# Patient Record
Sex: Female | Born: 1950 | Race: White | Hispanic: No | Marital: Married | Smoking: Never smoker
Health system: Southern US, Community
[De-identification: ages and names within clinical notes are randomized; demographics above are authoritative.]

## PROBLEM LIST (undated history)

## (undated) DIAGNOSIS — K219 Gastro-esophageal reflux disease without esophagitis: Secondary | ICD-10-CM

## (undated) DIAGNOSIS — Z87442 Personal history of urinary calculi: Secondary | ICD-10-CM

## (undated) DIAGNOSIS — D126 Benign neoplasm of colon, unspecified: Secondary | ICD-10-CM

## (undated) DIAGNOSIS — M8580 Other specified disorders of bone density and structure, unspecified site: Secondary | ICD-10-CM

## (undated) DIAGNOSIS — T7840XA Allergy, unspecified, initial encounter: Secondary | ICD-10-CM

## (undated) DIAGNOSIS — F32A Depression, unspecified: Secondary | ICD-10-CM

## (undated) DIAGNOSIS — G4730 Sleep apnea, unspecified: Secondary | ICD-10-CM

## (undated) DIAGNOSIS — Q819 Epidermolysis bullosa, unspecified: Secondary | ICD-10-CM

## (undated) DIAGNOSIS — E785 Hyperlipidemia, unspecified: Secondary | ICD-10-CM

## (undated) DIAGNOSIS — Z8489 Family history of other specified conditions: Secondary | ICD-10-CM

## (undated) DIAGNOSIS — F329 Major depressive disorder, single episode, unspecified: Secondary | ICD-10-CM

## (undated) DIAGNOSIS — G4733 Obstructive sleep apnea (adult) (pediatric): Secondary | ICD-10-CM

## (undated) DIAGNOSIS — M1990 Unspecified osteoarthritis, unspecified site: Secondary | ICD-10-CM

## (undated) DIAGNOSIS — G709 Myoneural disorder, unspecified: Secondary | ICD-10-CM

## (undated) DIAGNOSIS — E119 Type 2 diabetes mellitus without complications: Secondary | ICD-10-CM

## (undated) HISTORY — PX: KNEE ARTHROSCOPY: SHX127

## (undated) HISTORY — PX: APPENDECTOMY: SHX54

## (undated) HISTORY — PX: VAGINAL HYSTERECTOMY: SHX2639

## (undated) HISTORY — DX: Obstructive sleep apnea (adult) (pediatric): G47.33

## (undated) HISTORY — PX: SHOULDER SURGERY: SHX246

## (undated) HISTORY — PX: COLONOSCOPY: SHX174

## (undated) HISTORY — DX: Epidermolysis bullosa, unspecified: Q81.9

## (undated) HISTORY — DX: Depression, unspecified: F32.A

## (undated) HISTORY — DX: Other specified disorders of bone density and structure, unspecified site: M85.80

## (undated) HISTORY — DX: Benign neoplasm of colon, unspecified: D12.6

## (undated) HISTORY — DX: Gastro-esophageal reflux disease without esophagitis: K21.9

## (undated) HISTORY — PX: ABDOMINAL HYSTERECTOMY: SHX81

## (undated) HISTORY — DX: Unspecified osteoarthritis, unspecified site: M19.90

## (undated) HISTORY — DX: Myoneural disorder, unspecified: G70.9

## (undated) HISTORY — PX: POLYPECTOMY: SHX149

## (undated) HISTORY — DX: Sleep apnea, unspecified: G47.30

## (undated) HISTORY — DX: Allergy, unspecified, initial encounter: T78.40XA

## (undated) HISTORY — DX: Type 2 diabetes mellitus without complications: E11.9

## (undated) HISTORY — DX: Hyperlipidemia, unspecified: E78.5

## (undated) HISTORY — DX: Major depressive disorder, single episode, unspecified: F32.9

## (undated) HISTORY — DX: Personal history of urinary calculi: Z87.442

---

## 1991-07-28 DIAGNOSIS — D126 Benign neoplasm of colon, unspecified: Secondary | ICD-10-CM

## 1991-07-28 HISTORY — DX: Benign neoplasm of colon, unspecified: D12.6

## 2000-10-14 ENCOUNTER — Other Ambulatory Visit: Admission: RE | Admit: 2000-10-14 | Discharge: 2000-10-14 | Payer: Self-pay | Admitting: Obstetrics and Gynecology

## 2001-03-16 ENCOUNTER — Ambulatory Visit (HOSPITAL_COMMUNITY): Admission: RE | Admit: 2001-03-16 | Discharge: 2001-03-16 | Payer: Self-pay | Admitting: Obstetrics and Gynecology

## 2001-03-16 ENCOUNTER — Encounter: Payer: Self-pay | Admitting: Obstetrics and Gynecology

## 2001-10-18 ENCOUNTER — Other Ambulatory Visit: Admission: RE | Admit: 2001-10-18 | Discharge: 2001-10-18 | Payer: Self-pay | Admitting: Obstetrics and Gynecology

## 2002-03-21 ENCOUNTER — Encounter: Payer: Self-pay | Admitting: Obstetrics and Gynecology

## 2002-03-21 ENCOUNTER — Ambulatory Visit (HOSPITAL_COMMUNITY): Admission: RE | Admit: 2002-03-21 | Discharge: 2002-03-21 | Payer: Self-pay | Admitting: Obstetrics and Gynecology

## 2003-04-10 ENCOUNTER — Ambulatory Visit (HOSPITAL_COMMUNITY): Admission: RE | Admit: 2003-04-10 | Discharge: 2003-04-10 | Payer: Self-pay | Admitting: Obstetrics and Gynecology

## 2003-04-10 ENCOUNTER — Encounter: Payer: Self-pay | Admitting: Obstetrics and Gynecology

## 2004-04-15 ENCOUNTER — Ambulatory Visit (HOSPITAL_COMMUNITY): Admission: RE | Admit: 2004-04-15 | Discharge: 2004-04-15 | Payer: Self-pay | Admitting: Obstetrics and Gynecology

## 2005-05-01 ENCOUNTER — Ambulatory Visit (HOSPITAL_COMMUNITY): Admission: RE | Admit: 2005-05-01 | Discharge: 2005-05-01 | Payer: Self-pay | Admitting: Obstetrics and Gynecology

## 2006-05-04 ENCOUNTER — Ambulatory Visit (HOSPITAL_COMMUNITY): Admission: RE | Admit: 2006-05-04 | Discharge: 2006-05-04 | Payer: Self-pay | Admitting: Obstetrics and Gynecology

## 2006-05-17 ENCOUNTER — Encounter: Admission: RE | Admit: 2006-05-17 | Discharge: 2006-05-17 | Payer: Self-pay | Admitting: Obstetrics and Gynecology

## 2006-10-29 ENCOUNTER — Encounter: Admission: RE | Admit: 2006-10-29 | Discharge: 2006-10-29 | Payer: Self-pay | Admitting: Obstetrics and Gynecology

## 2007-05-09 ENCOUNTER — Encounter: Admission: RE | Admit: 2007-05-09 | Discharge: 2007-05-09 | Payer: Self-pay | Admitting: Obstetrics and Gynecology

## 2007-06-07 ENCOUNTER — Ambulatory Visit: Payer: Self-pay | Admitting: Gastroenterology

## 2007-06-21 ENCOUNTER — Ambulatory Visit: Payer: Self-pay | Admitting: Gastroenterology

## 2007-11-11 ENCOUNTER — Encounter: Admission: RE | Admit: 2007-11-11 | Discharge: 2007-11-11 | Payer: Self-pay | Admitting: Obstetrics and Gynecology

## 2008-05-10 ENCOUNTER — Encounter: Admission: RE | Admit: 2008-05-10 | Discharge: 2008-05-10 | Payer: Self-pay | Admitting: Obstetrics and Gynecology

## 2009-05-13 ENCOUNTER — Encounter: Admission: RE | Admit: 2009-05-13 | Discharge: 2009-05-13 | Payer: Self-pay | Admitting: Obstetrics and Gynecology

## 2010-05-14 ENCOUNTER — Encounter: Admission: RE | Admit: 2010-05-14 | Discharge: 2010-05-14 | Payer: Self-pay | Admitting: Obstetrics and Gynecology

## 2010-10-13 ENCOUNTER — Emergency Department (HOSPITAL_COMMUNITY): Payer: BC Managed Care – PPO

## 2010-10-13 ENCOUNTER — Emergency Department (HOSPITAL_COMMUNITY)
Admission: EM | Admit: 2010-10-13 | Discharge: 2010-10-13 | Disposition: A | Discharge status: Home / Self Care | Payer: BC Managed Care – PPO | Attending: Emergency Medicine | Admitting: Emergency Medicine

## 2010-10-13 DIAGNOSIS — R10813 Right lower quadrant abdominal tenderness: Secondary | ICD-10-CM | POA: Insufficient documentation

## 2010-10-13 DIAGNOSIS — R109 Unspecified abdominal pain: Secondary | ICD-10-CM | POA: Insufficient documentation

## 2010-10-13 DIAGNOSIS — Z79899 Other long term (current) drug therapy: Secondary | ICD-10-CM | POA: Insufficient documentation

## 2010-10-13 DIAGNOSIS — N200 Calculus of kidney: Secondary | ICD-10-CM | POA: Insufficient documentation

## 2010-10-13 DIAGNOSIS — R110 Nausea: Secondary | ICD-10-CM | POA: Insufficient documentation

## 2010-10-13 LAB — URINALYSIS, ROUTINE W REFLEX MICROSCOPIC
Bilirubin Urine: NEGATIVE
Ketones, ur: 40 mg/dL — AB
Nitrite: NEGATIVE
Specific Gravity, Urine: 1.03 (ref 1.005–1.030)
Urobilinogen, UA: 0.2 mg/dL (ref 0.0–1.0)
pH: 6 (ref 5.0–8.0)

## 2010-10-13 LAB — URINE MICROSCOPIC-ADD ON

## 2010-10-15 LAB — POCT I-STAT, CHEM 8
HCT: 45 % (ref 36.0–46.0)
Hemoglobin: 15.3 g/dL — ABNORMAL HIGH (ref 12.0–15.0)
Potassium: 4 mEq/L (ref 3.5–5.1)
Sodium: 141 mEq/L (ref 135–145)
TCO2: 23 mmol/L (ref 0–100)

## 2011-04-06 ENCOUNTER — Other Ambulatory Visit: Payer: Self-pay | Admitting: Obstetrics and Gynecology

## 2011-04-06 DIAGNOSIS — Z1231 Encounter for screening mammogram for malignant neoplasm of breast: Secondary | ICD-10-CM

## 2011-05-18 ENCOUNTER — Ambulatory Visit
Admission: RE | Admit: 2011-05-18 | Discharge: 2011-05-18 | Disposition: A | Discharge status: Home / Self Care | Payer: BC Managed Care – PPO | Source: Ambulatory Visit | Attending: Obstetrics and Gynecology | Admitting: Obstetrics and Gynecology

## 2011-05-18 DIAGNOSIS — Z1231 Encounter for screening mammogram for malignant neoplasm of breast: Secondary | ICD-10-CM

## 2012-01-22 ENCOUNTER — Other Ambulatory Visit: Payer: Self-pay | Admitting: Family Medicine

## 2012-01-22 ENCOUNTER — Ambulatory Visit
Admission: RE | Admit: 2012-01-22 | Discharge: 2012-01-22 | Disposition: A | Discharge status: Home / Self Care | Payer: BC Managed Care – PPO | Source: Ambulatory Visit | Attending: Family Medicine | Admitting: Family Medicine

## 2012-01-22 DIAGNOSIS — R509 Fever, unspecified: Secondary | ICD-10-CM

## 2012-05-03 ENCOUNTER — Other Ambulatory Visit: Payer: Self-pay | Admitting: Obstetrics and Gynecology

## 2012-05-03 DIAGNOSIS — Z1231 Encounter for screening mammogram for malignant neoplasm of breast: Secondary | ICD-10-CM

## 2012-05-17 ENCOUNTER — Encounter: Payer: Self-pay | Admitting: Gastroenterology

## 2012-05-19 ENCOUNTER — Encounter: Payer: Self-pay | Admitting: Gastroenterology

## 2012-05-20 ENCOUNTER — Ambulatory Visit
Admission: RE | Admit: 2012-05-20 | Discharge: 2012-05-20 | Disposition: A | Discharge status: Home / Self Care | Payer: BC Managed Care – PPO | Source: Ambulatory Visit | Attending: Obstetrics and Gynecology | Admitting: Obstetrics and Gynecology

## 2012-05-20 DIAGNOSIS — Z1231 Encounter for screening mammogram for malignant neoplasm of breast: Secondary | ICD-10-CM

## 2012-06-30 ENCOUNTER — Encounter: Payer: BC Managed Care – PPO | Admitting: Gastroenterology

## 2012-07-11 ENCOUNTER — Telehealth: Payer: Self-pay | Admitting: *Deleted

## 2012-07-11 NOTE — Telephone Encounter (Signed)
Spoke with pt and [facility] previsit to 07/14/12

## 2012-07-14 ENCOUNTER — Ambulatory Visit (AMBULATORY_SURGERY_CENTER): Payer: BC Managed Care – PPO | Admitting: *Deleted

## 2012-07-14 VITALS — Ht 67.0 in | Wt 219.6 lb

## 2012-07-14 DIAGNOSIS — Z86010 Personal history of colonic polyps: Secondary | ICD-10-CM

## 2012-07-14 DIAGNOSIS — Z1211 Encounter for screening for malignant neoplasm of colon: Secondary | ICD-10-CM

## 2012-07-14 MED ORDER — NA SULFATE-K SULFATE-MG SULF 17.5-3.13-1.6 GM/177ML PO SOLN: ORAL | Status: DC

## 2012-07-14 NOTE — Progress Notes (Signed)
Patient request small volume prep,she states she can not drink all fluids without vomiting. Suprep given and patient seems to think she can drink it all. Encouraged patient to drink all and to make sure to get cleaned out. She understands.

## 2012-07-26 ENCOUNTER — Encounter: Payer: Self-pay | Admitting: Gastroenterology

## 2012-07-26 ENCOUNTER — Ambulatory Visit (AMBULATORY_SURGERY_CENTER): Payer: BC Managed Care – PPO | Admitting: Gastroenterology

## 2012-07-26 VITALS — BP 121/56 | HR 64 | Temp 98.9°F | Resp 14 | Ht 67.0 in | Wt 219.0 lb

## 2012-07-26 DIAGNOSIS — Z1211 Encounter for screening for malignant neoplasm of colon: Secondary | ICD-10-CM

## 2012-07-26 DIAGNOSIS — Z86010 Personal history of colonic polyps: Secondary | ICD-10-CM

## 2012-07-26 DIAGNOSIS — D126 Benign neoplasm of colon, unspecified: Secondary | ICD-10-CM

## 2012-07-26 MED ORDER — SODIUM CHLORIDE 0.9 % IV SOLN: 500.0000 mL | INTRAVENOUS | Status: DC

## 2012-07-26 NOTE — Patient Instructions (Addendum)

## 2012-07-26 NOTE — Op Note (Signed)
[facility] [address] [place], [address]   COLONOSCOPY PROCEDURE REPORT  PATIENT: Nichole Sandoval, Nichole Sandoval  MR#: [id number] BIRTHDATE: 1951-06-25 , 61  yrs. old GENDER: Female ENDOSCOPIST: Meryl Dare, MD, [facility] PROCEDURE DATE:  07/26/2012 PROCEDURE:   Colonoscopy with snare polypectomy ASA CLASS:   Class II INDICATIONS:Patient's personal history of adenomatous colon polyps.  MEDICATIONS: MAC sedation, administered by CRNA and propofol (Diprivan) 300mg  IV  DESCRIPTION OF PROCEDURE:   After the risks benefits and alternatives of the procedure were thoroughly explained, informed consent was obtained.  A digital rectal exam revealed no abnormalities of the rectum.   The Fuse-Demo-Scope  endoscope was introduced through the anus and advanced to the cecum, which was identified by both the appendix and ileocecal valve. No adverse events experienced.   The quality of the prep was excellent, using MoviPrep  The instrument was then slowly withdrawn as the colon was fully examined.     COLON FINDINGS: Three sessile polyps measuring 5-6 mm in size were found in the transverse colon.  A polypectomy was performed with a cold snare.  The resection was complete and the polyp tissue was completely retrieved.   Mild diverticulosis was noted in the sigmoid colon.   The colon was otherwise normal.  There was no diverticulosis, inflammation, polyps or cancers unless previously stated.  Retroflexed views revealed no abnormalities. The time to cecum=4 minutes 57 seconds.  Withdrawal time=11 minutes 11 seconds. The scope was withdrawn and the procedure completed.  COMPLICATIONS: There were no complications.  ENDOSCOPIC IMPRESSION: 1.   Three sessile polyps measuring 5-6 mm in the transverse colon; polypectomy performed with a cold snare 2.   Mild diverticulosis was noted in the sigmoid colon 3.   The colon was otherwise normal  RECOMMENDATIONS: 1.  Await pathology  results 2.  High fiber diet with liberal fluid intake. 3.  Repeat Colonoscopy in 5 years.   eSigned:  Meryl Dare, MD, [facility] 07/26/2012 12:26 PM   cc: Gildardo Cranker MD

## 2012-07-26 NOTE — Progress Notes (Signed)
Patient did not experience any of the following events: a burn prior to discharge; a fall within the facility; wrong site/side/patient/procedure/implant event; or a hospital transfer or hospital admission upon discharge from the facility. (G8907) Patient did not have preoperative order for IV antibiotic SSI prophylaxis. (G8918)  

## 2012-07-27 DIAGNOSIS — S060X9A Concussion with loss of consciousness of unspecified duration, initial encounter: Secondary | ICD-10-CM | POA: Insufficient documentation

## 2012-07-28 ENCOUNTER — Telehealth: Payer: Self-pay | Admitting: *Deleted

## 2012-07-28 NOTE — Telephone Encounter (Signed)
  Follow up Call-  Call back number 07/26/2012  Post procedure Call Back phone  # [phone]  Permission to leave phone message Yes     Patient questions:  Do you have a fever, pain , or abdominal swelling? no Pain Score  0 *  Have you tolerated food without any problems? yes  Have you been able to return to your normal activities? yes  Do you have any questions about your discharge instructions: Diet   no Medications  no Follow up visit  no  Do you have questions or concerns about your Care? no  Actions: * If pain score is 4 or above: No action needed, pain <4.

## 2012-08-02 ENCOUNTER — Encounter: Payer: Self-pay | Admitting: Gastroenterology

## 2013-04-21 ENCOUNTER — Other Ambulatory Visit: Payer: Self-pay

## 2013-04-21 DIAGNOSIS — Z1231 Encounter for screening mammogram for malignant neoplasm of breast: Secondary | ICD-10-CM

## 2013-05-26 ENCOUNTER — Ambulatory Visit
Admission: RE | Admit: 2013-05-26 | Discharge: 2013-05-26 | Disposition: A | Discharge status: Home / Self Care | Payer: BC Managed Care – PPO | Source: Ambulatory Visit

## 2013-05-26 DIAGNOSIS — Z1231 Encounter for screening mammogram for malignant neoplasm of breast: Secondary | ICD-10-CM

## 2013-09-07 ENCOUNTER — Other Ambulatory Visit: Payer: Self-pay | Admitting: Otolaryngology

## 2013-09-07 DIAGNOSIS — H903 Sensorineural hearing loss, bilateral: Secondary | ICD-10-CM

## 2013-09-07 DIAGNOSIS — H905 Unspecified sensorineural hearing loss: Secondary | ICD-10-CM

## 2013-09-10 ENCOUNTER — Other Ambulatory Visit: Payer: BC Managed Care – PPO

## 2013-09-14 ENCOUNTER — Ambulatory Visit
Admission: RE | Admit: 2013-09-14 | Discharge: 2013-09-14 | Disposition: A | Discharge status: Home / Self Care | Payer: BC Managed Care – PPO | Source: Ambulatory Visit | Attending: Otolaryngology | Admitting: Otolaryngology

## 2013-09-14 DIAGNOSIS — H905 Unspecified sensorineural hearing loss: Secondary | ICD-10-CM

## 2013-09-14 DIAGNOSIS — H903 Sensorineural hearing loss, bilateral: Secondary | ICD-10-CM

## 2013-09-14 MED ORDER — GADOBENATE DIMEGLUMINE 529 MG/ML IV SOLN
20.0000 mL | Freq: Once | INTRAVENOUS | Status: AC | PRN
  Administered 2013-09-14: 20 mL via INTRAVENOUS

## 2013-11-06 ENCOUNTER — Telehealth: Payer: Self-pay | Admitting: Gastroenterology

## 2013-11-06 NOTE — Telephone Encounter (Signed)
Patient notified that polyps were tubular adenomas.  All questions answered.  She will call back for any additional questions or concersn

## 2013-12-25 ENCOUNTER — Other Ambulatory Visit: Payer: Self-pay | Admitting: Family Medicine

## 2014-12-27 ENCOUNTER — Encounter: Payer: Self-pay | Admitting: Gastroenterology

## 2015-02-27 ENCOUNTER — Ambulatory Visit: Payer: BC Managed Care – PPO | Admitting: Gastroenterology

## 2015-05-06 ENCOUNTER — Encounter: Payer: Self-pay | Admitting: Gastroenterology

## 2015-05-06 ENCOUNTER — Ambulatory Visit (INDEPENDENT_AMBULATORY_CARE_PROVIDER_SITE_OTHER): Payer: BC Managed Care – PPO | Admitting: Gastroenterology

## 2015-05-06 VITALS — BP 146/80 | HR 80 | Ht 67.0 in | Wt 220.8 lb

## 2015-05-06 DIAGNOSIS — Z86010 Personal history of colonic polyps: Secondary | ICD-10-CM | POA: Diagnosis not present

## 2015-05-06 DIAGNOSIS — R1310 Dysphagia, unspecified: Secondary | ICD-10-CM | POA: Diagnosis not present

## 2015-05-06 NOTE — Patient Instructions (Signed)
You have been scheduled for a Barium Esophogram at [facility] (1st floor of the hospital) on 05/09/15 at 11:00am. Please arrive 15 minutes prior to your appointment for registration. Make certain not to have anything to eat or drink 3 hours prior to your test. If you need to reschedule for any reason, please contact radiology at [phone] to do so. __________________________________________________________________ A barium swallow is an examination that concentrates on views of the esophagus. This tends to be a double contrast exam (barium and two liquids which, when combined, create a gas to distend the wall of the oesophagus) or single contrast (non-ionic iodine based). The study is usually tailored to your symptoms so a good history is essential. Attention is paid during the study to the form, structure and configuration of the esophagus, looking for functional disorders (such as aspiration, dysphagia, achalasia, motility and reflux) EXAMINATION You may be asked to change into a gown, depending on the type of swallow being performed. A radiologist and radiographer will perform the procedure. The radiologist will advise you of the type of contrast selected for your procedure and direct you during the exam. You will be asked to stand, sit or lie in several different positions and to hold a small amount of fluid in your mouth before being asked to swallow while the imaging is performed .In some instances you may be asked to swallow barium coated marshmallows to assess the motility of a solid food bolus. The exam can be recorded as a digital or video fluoroscopy procedure. POST PROCEDURE It will take 1-2 days for the barium to pass through your system. To facilitate this, it is important, unless otherwise directed, to increase your fluids for the next 24-48hrs and to resume your normal diet.  This test typically takes about 30 minutes to  perform. __________________________________________________________________________________  Thank you for choosing me and [facility].  Pricilla Riffle. Dagoberto Ligas., MD., Mar[facility] Regal

## 2015-05-06 NOTE — Progress Notes (Signed)
    History of Present Illness: This is a 64 year old female who relates intermittent difficulty swallowing hot liquids. Over the past 1-2 years she has noted a few episodes of choking and immediately expelling a hot beverage. She has no difficulties with cold and room temperature beverages and no difficulty swallowing any solid foods. Shortly after these episodes she was able to swallow other foods and normal temperature liquids. 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 She appears to have a very mild case. Denies weight loss, abdominal pain, constipation, diarrhea, change in stool caliber, melena, hematochezia, nausea, vomiting, dysphagia, reflux symptoms, chest pain.  Current Medications, Allergies, Past Medical History, Past Surgical History, Family History and Social History were reviewed in [employer] record.  Physical Exam: General: Well developed, well nourished, no acute distress Head: Normocephalic and atraumatic Eyes:  sclerae anicteric, EOMI Ears: Normal auditory acuity Mouth: No deformity or lesions Lungs: Clear throughout to auscultation Heart: Regular rate and rhythm; no murmurs, rubs or bruits Abdomen: Soft, non tender and non distended. No masses, hepatosplenomegaly or hernias noted. Normal Bowel sounds Musculoskeletal: Symmetrical with no gross deformities  Pulses:  Normal pulses noted Extremities: No clubbing, cyanosis, edema or deformities noted Neurological: Alert oriented x 4, grossly nonfocal Psychological:  Alert and cooperative. Normal mood and affect  Assessment and Recommendations:  1. Intermittent dysphagia with hot liquids. Symptoms are not typical for GERD, an esophageal stricture, Zenkers or esophageal motility disorder. She probably has oral or esophageal sensitivity to hot liquids. I doubt this is related to her epidermolysis bullosa but it is possible. Schedule barium  esophagram for further evaluation. Continue to avoid hot liquids.  2. Personal history of adenomatous colon polyps. Five-year interval surveillance colonoscopy due December 2018.  3. Epidermolysis bullosa.  4. OSA.

## 2015-05-09 ENCOUNTER — Ambulatory Visit (HOSPITAL_COMMUNITY)
Admission: RE | Admit: 2015-05-09 | Discharge: 2015-05-09 | Disposition: A | Discharge status: Home / Self Care | Payer: BC Managed Care – PPO | Source: Ambulatory Visit | Attending: Gastroenterology | Admitting: Gastroenterology

## 2015-05-09 DIAGNOSIS — K224 Dyskinesia of esophagus: Secondary | ICD-10-CM | POA: Diagnosis not present

## 2015-05-09 DIAGNOSIS — R1310 Dysphagia, unspecified: Secondary | ICD-10-CM | POA: Diagnosis present

## 2015-05-09 DIAGNOSIS — R1110 Vomiting, unspecified: Secondary | ICD-10-CM | POA: Diagnosis not present

## 2016-07-03 ENCOUNTER — Encounter (HOSPITAL_COMMUNITY): Payer: Self-pay | Admitting: Emergency Medicine

## 2016-07-03 ENCOUNTER — Emergency Department (HOSPITAL_COMMUNITY): Payer: BC Managed Care – PPO

## 2016-07-03 ENCOUNTER — Emergency Department (HOSPITAL_COMMUNITY)
Admission: EM | Admit: 2016-07-03 | Discharge: 2016-07-03 | Disposition: A | Discharge status: Home / Self Care | Payer: BC Managed Care – PPO | Attending: Emergency Medicine | Admitting: Emergency Medicine

## 2016-07-03 DIAGNOSIS — N23 Unspecified renal colic: Secondary | ICD-10-CM

## 2016-07-03 DIAGNOSIS — N201 Calculus of ureter: Secondary | ICD-10-CM | POA: Diagnosis not present

## 2016-07-03 DIAGNOSIS — R109 Unspecified abdominal pain: Secondary | ICD-10-CM | POA: Diagnosis present

## 2016-07-03 LAB — COMPREHENSIVE METABOLIC PANEL
ALT: 21 U/L (ref 14–54)
ANION GAP: 10 (ref 5–15)
AST: 22 U/L (ref 15–41)
Albumin: 4 g/dL (ref 3.5–5.0)
Alkaline Phosphatase: 85 U/L (ref 38–126)
BUN: 17 mg/dL (ref 6–20)
CALCIUM: 9 mg/dL (ref 8.9–10.3)
CHLORIDE: 104 mmol/L (ref 101–111)
CO2: 24 mmol/L (ref 22–32)
CREATININE: 0.95 mg/dL (ref 0.44–1.00)
Glucose, Bld: 192 mg/dL — ABNORMAL HIGH (ref 65–99)
Potassium: 3.8 mmol/L (ref 3.5–5.1)
Sodium: 138 mmol/L (ref 135–145)
Total Bilirubin: 0.7 mg/dL (ref 0.3–1.2)
Total Protein: 7.4 g/dL (ref 6.5–8.1)

## 2016-07-03 LAB — CBC WITH DIFFERENTIAL/PLATELET
Basophils Absolute: 0 10*3/uL (ref 0.0–0.1)
Basophils Relative: 0 %
EOS PCT: 1 %
Eosinophils Absolute: 0.1 10*3/uL (ref 0.0–0.7)
HCT: 40.9 % (ref 36.0–46.0)
Hemoglobin: 13.8 g/dL (ref 12.0–15.0)
LYMPHS ABS: 2.3 10*3/uL (ref 0.7–4.0)
LYMPHS PCT: 24 %
MCH: 30.1 pg (ref 26.0–34.0)
MCHC: 33.7 g/dL (ref 30.0–36.0)
MCV: 89.3 fL (ref 78.0–100.0)
MONO ABS: 0.6 10*3/uL (ref 0.1–1.0)
MONOS PCT: 6 %
Neutro Abs: 6.5 10*3/uL (ref 1.7–7.7)
Neutrophils Relative %: 69 %
PLATELETS: 290 10*3/uL (ref 150–400)
RBC: 4.58 MIL/uL (ref 3.87–5.11)
RDW: 13.9 % (ref 11.5–15.5)
WBC: 9.5 10*3/uL (ref 4.0–10.5)

## 2016-07-03 LAB — URINALYSIS, ROUTINE W REFLEX MICROSCOPIC
Bilirubin Urine: NEGATIVE
GLUCOSE, UA: NEGATIVE mg/dL
Ketones, ur: NEGATIVE mg/dL
NITRITE: NEGATIVE
Protein, ur: 30 mg/dL — AB
SPECIFIC GRAVITY, URINE: 1.018 (ref 1.005–1.030)
pH: 5 (ref 5.0–8.0)

## 2016-07-03 LAB — LIPASE, BLOOD: LIPASE: 26 U/L (ref 11–51)

## 2016-07-03 MED ORDER — FENTANYL CITRATE (PF) 100 MCG/2ML IJ SOLN
50.0000 ug | INTRAMUSCULAR | Status: DC | PRN
  Administered 2016-07-03 (×2): 50 ug via INTRAVENOUS
  Filled 2016-07-03 (×2): qty 2

## 2016-07-03 MED ORDER — KETOROLAC TROMETHAMINE 30 MG/ML IJ SOLN
30.0000 mg | Freq: Once | INTRAMUSCULAR | Status: AC
  Administered 2016-07-03: 30 mg via INTRAVENOUS
  Filled 2016-07-03: qty 1

## 2016-07-03 MED ORDER — ONDANSETRON 4 MG PO TBDP: ORAL_TABLET | ORAL | 0 refills | Status: DC

## 2016-07-03 MED ORDER — ONDANSETRON HCL 4 MG/2ML IJ SOLN
4.0000 mg | Freq: Once | INTRAMUSCULAR | Status: AC
  Administered 2016-07-03: 4 mg via INTRAVENOUS
  Filled 2016-07-03: qty 2

## 2016-07-03 MED ORDER — OXYCODONE-ACETAMINOPHEN 5-325 MG PO TABS: 1.0000 | ORAL_TABLET | Freq: Four times a day (QID) | ORAL | 0 refills | Status: DC | PRN

## 2016-07-03 MED ORDER — TAMSULOSIN HCL 0.4 MG PO CAPS: 0.4000 mg | ORAL_CAPSULE | Freq: Two times a day (BID) | ORAL | 0 refills | Status: DC

## 2016-07-03 MED ORDER — ONDANSETRON HCL 4 MG/2ML IJ SOLN
4.0000 mg | Freq: Once | INTRAMUSCULAR | Status: AC | PRN
  Administered 2016-07-03: 4 mg via INTRAVENOUS
  Filled 2016-07-03: qty 2

## 2016-07-03 MED ORDER — TAMSULOSIN HCL 0.4 MG PO CAPS
0.8000 mg | ORAL_CAPSULE | Freq: Once | ORAL | Status: AC
  Administered 2016-07-03: 0.8 mg via ORAL
  Filled 2016-07-03: qty 2

## 2016-07-03 MED ORDER — OXYCODONE-ACETAMINOPHEN 5-325 MG PO TABS
2.0000 | ORAL_TABLET | Freq: Once | ORAL | Status: AC
  Administered 2016-07-03: 2 via ORAL
  Filled 2016-07-03: qty 2

## 2016-07-03 NOTE — ED Provider Notes (Signed)
[place] DEPT Provider Note   CSN: [id number] Arrival date & time: 07/03/16  0240     History   Chief Complaint Chief Complaint  Patient presents with  . Abdominal Pain    HPI Nichole Sandoval is a 65 y.o. female with a hx of GERD, kidney stones, OSA presents to the Emergency Department complaining of acute, waxing and waning progressively worsening left flank pain that woke her from sleep onset midnight. Pt describes the pain as sharp and stabbing. Associated symptoms include nausea, vomiting x1 with NBNB emesis.  Initially with minimal diaphoresis.  Pt with Hx of kidney stones 4 years ago and was seen by [facility].  Pain is similar tonight.  Pt reports being prediabetic with baseline CBG 140.  No aggravating or alleviating factors.  Pt reports c-section, hysterectomy and appendectomy.  No falls or trauma to the back.  No urinary or vaginal symptoms.  Pt denies fever, chills, headache, neck pain, chest pain, SOB.     The history is provided by the patient and medical records. No language interpreter was used.    Past Medical History:  Diagnosis Date  . Adenomatous colon polyp 1993  . Depression   . 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 GERD (gastroesophageal reflux disease)   . History of kidney stones   . Hyperlipidemia   . OSA (obstructive sleep apnea)   . Osteoarthritis   . Osteopenia   . Sleep apnea    Cpap    There are no active problems to display for this patient.   Past Surgical History:  Procedure Laterality Date  . APPENDECTOMY    . CESAREAN SECTION     x2  . COLONOSCOPY    . KNEE ARTHROSCOPY    . SHOULDER SURGERY    . VAGINAL HYSTERECTOMY      OB History    No data available       Home Medications    Prior to Admission medications   Medication Sig Start Date End Date Taking? Authorizing Provider  FLUoxetine (PROZAC) 20 MG tablet Take 20 mg by mouth 3 (three) times a week.    Historical Provider, MD  ondansetron  (ZOFRAN-ODT) 4 MG disintegrating tablet 4mg  ODT q6 hours prn nausea or before taking pain medication 07/03/16   Jarrett Soho Jamita Mckelvin, PA-C  oxyCODONE-acetaminophen (PERCOCET) 5-325 MG tablet Take 1 tablet by mouth every 6 (six) hours as needed for moderate pain. 07/03/16   Braelynne Garinger, PA-C  tamsulosin (FLOMAX) 0.4 MG CAPS capsule Take 1 capsule (0.4 mg total) by mouth 2 (two) times daily. 07/03/16   Jarrett Soho Chenae Brager, PA-C    Family History Family History  Problem Relation Age of Onset  . Rectal cancer Paternal Grandmother 29    lived to be  age 38  . Colon cancer Neg Hx   . Parkinson's disease Father     Social History Social History  Substance Use Topics  . Smoking status: Never Smoker  . Smokeless tobacco: Never Used  . Alcohol use No     Allergies   Augmentin [amoxicillin-pot clavulanate]   Review of Systems Review of Systems  Constitutional: Positive for diaphoresis ( resolved).  Gastrointestinal: Positive for abdominal pain, nausea and vomiting ( x1).  Genitourinary: Positive for flank pain. Negative for dysuria and urgency.  Skin: Negative for rash.  All other systems reviewed and are negative.    Physical Exam Updated Vital Signs BP (!) 202/95 (BP Location: Right Arm)   Pulse 83  Temp 98.1 F (36.7 C) (Oral)   Resp 18   Ht 5\' 7"  (1.702 m)   Wt 99.8 kg   SpO2 94%   BMI 34.46 kg/m   Physical Exam  Constitutional: She appears well-developed and well-nourished. No distress.  Awake, alert, nontoxic appearance  HENT:  Head: Normocephalic and atraumatic.  Mouth/Throat: Oropharynx is clear and moist. No oropharyngeal exudate.  Eyes: Conjunctivae are normal. No scleral icterus.  Neck: Normal range of motion. Neck supple.  Cardiovascular: Normal rate, regular rhythm, normal heart sounds and intact distal pulses.   Pulmonary/Chest: Effort normal and breath sounds normal. No respiratory distress. She has no wheezes.  Equal chest expansion  Abdominal:  Soft. Bowel sounds are normal. She exhibits no distension and no mass. There is tenderness ( mild) in the left lower quadrant. There is CVA tenderness (left). There is no rigidity, no rebound and no guarding.  Musculoskeletal: Normal range of motion. She exhibits no edema.  Neurological: She is alert.  Speech is clear and goal oriented Moves extremities without ataxia  Skin: Skin is warm and dry. No rash noted. She is not diaphoretic.  Psychiatric: She has a normal mood and affect.  Nursing note and vitals reviewed.    ED Treatments / Results  Labs (all labs ordered are listed, but only abnormal results are displayed) Labs Reviewed  COMPREHENSIVE METABOLIC PANEL - Abnormal; Notable for the following:       Result Value   Glucose, Bld 192 (*)    All other components within normal limits  URINALYSIS, ROUTINE W REFLEX MICROSCOPIC - Abnormal; Notable for the following:    Hgb urine dipstick LARGE (*)    Protein, ur 30 (*)    Leukocytes, UA TRACE (*)    Bacteria, UA RARE (*)    Squamous Epithelial / LPF 0-5 (*)    All other components within normal limits  LIPASE, BLOOD  CBC WITH DIFFERENTIAL/PLATELET     Radiology Ct Renal Stone Study  Result Date: 07/03/2016 CLINICAL DATA:  LEFT flank pain beginning at midnight, nausea and vomiting. History of kidney stones, appendectomy and hysterectomy. EXAM: CT ABDOMEN AND PELVIS WITHOUT CONTRAST TECHNIQUE: Multidetector CT imaging of the abdomen and pelvis was performed following the standard protocol without IV contrast. COMPARISON:  CT abdomen and pelvis October 13, 2010 FINDINGS: LOWER CHEST: Dependent atelectasis. Subcentimeter RIGHT lower lobe air cyst. The visualized heart size is normal. No pericardial effusion. HEPATOBILIARY: Low-density liver compatible with steatosis with mild focal fatty sparing about the gallbladder fossa. PANCREAS: Normal. SPLEEN: Normal. ADRENALS/URINARY TRACT: Kidneys are orthotopic, demonstrating normal size and  morphology. Mild LEFT hydroureteronephrosis the proximal ureter where a 5 mm calculus is present. No residual nephrolithiasis. No RIGHT obstructive uropathy. Limited assessment for renal masses on this nonenhanced examination. The unopacified ureters are normal in course and caliber. Urinary bladder is decompressed and unremarkable. Normal adrenal glands. STOMACH/BOWEL: The stomach, small and large bowel are normal in course and caliber without inflammatory changes, sensitivity decreased by lack of enteric contrast. 3.2 cm duodenum cyst. Mild amount of retained large bowel stool. VASCULAR/LYMPHATIC: Aortoiliac vessels are normal in course and caliber. No lymphadenopathy by CT size criteria. REPRODUCTIVE: Status post hysterectomy. OTHER: No intraperitoneal free fluid or free air. MUSCULOSKELETAL: Non-acute. Mild levoscoliosis. Small fat containing umbilical hernia in mild rectus abdominis diastases. Moderate to severe L3-4, moderate L4-5 degenerative discs. IMPRESSION: 5 mm proximal LEFT ureteral calculus results in mild obstructive uropathy. Hepatic steatosis. Electronically Signed   By: Thana Farr.D.  On: 07/03/2016 04:16    Procedures Procedures (including critical care time)  Medications Ordered in ED Medications  fentaNYL (SUBLIMAZE) injection 50 mcg (50 mcg Intravenous Given 07/03/16 0431)  ketorolac (TORADOL) 30 MG/ML injection 30 mg (not administered)  oxyCODONE-acetaminophen (PERCOCET/ROXICET) 5-325 MG per tablet 2 tablet (not administered)  ondansetron (ZOFRAN) injection 4 mg (not administered)  tamsulosin (FLOMAX) capsule 0.8 mg (not administered)  ondansetron (ZOFRAN) injection 4 mg (4 mg Intravenous Given 07/03/16 0323)     Initial Impression / Assessment and Plan / ED Course  I have reviewed the triage vital signs and the nursing notes.  Pertinent labs & imaging results that were available during my care of the patient were reviewed by me and considered in my medical decision  making (see chart for details).  Clinical Course as of Jul 03 530  Fri Jul 03, 2016  0347 Pain controlled after fentanyl.    [HM]  [id number] HTN on arrival.  No CP or SOB.   BP: (!) 202/95 [HM]  0348 afebrile Temp: 98.1 F (36.7 C) [HM]  0526 Hgb urine dipstick: (!) LARGE [HM]  0526 No evidence of UTI Nitrite: NEGATIVE [HM]  0527 Blood pressure improved significantly after pain control. BP: 166/84 [HM]  0527 Normal Creatinine: 0.95 [HM]  0527 No leukocytosis WBC: 9.5 [HM]  0527 5 mm proximal ureteral stone. Mild hydronephrosis. CT Renal Laren Everts [HM]    Clinical Course User Index [HM] Abigail Butts, PA-C   Patient presents with flank pain. Pt has been diagnosed with a Kidney Stone via CT and has a history of same. There is no evidence of significant hydronephrosis, serum creatine WNL, vitals sign stable and the pt does not have irratractable vomiting. Pain is well controlled here. Pt will be dc home with pain medications & has been advised to follow up with [facility] in 2-3 days. Discussed reasons to return to the emergency department including fever, persistent vomiting or intractable pain. Patient and husband state understanding and are in agreement with the plan.   Final Clinical Impressions(s) / ED Diagnoses   Final diagnoses:  Renal colic on left side  Left ureteral stone    New Prescriptions New Prescriptions   ONDANSETRON (ZOFRAN-ODT) 4 MG DISINTEGRATING TABLET    4mg  ODT q6 hours prn nausea or before taking pain medication   OXYCODONE-ACETAMINOPHEN (PERCOCET) 5-325 MG TABLET    Take 1 tablet by mouth every 6 (six) hours as needed for moderate pain.   TAMSULOSIN (FLOMAX) 0.4 MG CAPS CAPSULE    Take 1 capsule (0.4 mg total) by mouth 2 (two) times daily.     Jarrett Soho Livingston Denner, PA-C [id number]    Delora Fuel, MD [id number]

## 2016-07-03 NOTE — ED Triage Notes (Signed)
Patient states that she is having left flank pain. Patient states it started around midnight. Patient states at its worse the pain was 10/10. Patient has been vomiting and feels nauseas.

## 2016-07-03 NOTE — Discharge Instructions (Signed)
1. Medications: zofran, percocet, flomax, usual home medications 2. Treatment: rest, drink plenty of fluids,  3. Follow Up: Please followup with [facility] in 2-3 days for discussion of your diagnoses and further evaluation after today's visit; if you do not have a primary care doctor use the resource guide provided to find one; Please return to the ER for persistent vomiting, high fevers or worsening symptoms

## 2016-08-02 ENCOUNTER — Emergency Department (HOSPITAL_COMMUNITY): Payer: BC Managed Care – PPO

## 2016-08-02 ENCOUNTER — Encounter (HOSPITAL_COMMUNITY): Payer: Self-pay

## 2016-08-02 ENCOUNTER — Observation Stay (HOSPITAL_COMMUNITY)
Admission: EM | Admit: 2016-08-02 | Discharge: 2016-08-03 | Disposition: A | Discharge status: Home / Self Care | Payer: BC Managed Care – PPO | Attending: Urology | Admitting: Urology

## 2016-08-02 ENCOUNTER — Inpatient Hospital Stay (HOSPITAL_COMMUNITY): Payer: BC Managed Care – PPO | Admitting: Certified Registered Nurse Anesthetist

## 2016-08-02 ENCOUNTER — Encounter (HOSPITAL_COMMUNITY)
Admission: EM | Disposition: A | Discharge status: Home / Self Care | Payer: Self-pay | Source: Home / Self Care | Attending: Urology

## 2016-08-02 DIAGNOSIS — Q819 Epidermolysis bullosa, unspecified: Secondary | ICD-10-CM | POA: Insufficient documentation

## 2016-08-02 DIAGNOSIS — E785 Hyperlipidemia, unspecified: Secondary | ICD-10-CM | POA: Insufficient documentation

## 2016-08-02 DIAGNOSIS — Z87442 Personal history of urinary calculi: Secondary | ICD-10-CM | POA: Insufficient documentation

## 2016-08-02 DIAGNOSIS — N136 Pyonephrosis: Secondary | ICD-10-CM | POA: Diagnosis not present

## 2016-08-02 DIAGNOSIS — M1990 Unspecified osteoarthritis, unspecified site: Secondary | ICD-10-CM | POA: Diagnosis not present

## 2016-08-02 DIAGNOSIS — N201 Calculus of ureter: Secondary | ICD-10-CM

## 2016-08-02 DIAGNOSIS — Z881 Allergy status to other antibiotic agents status: Secondary | ICD-10-CM | POA: Insufficient documentation

## 2016-08-02 DIAGNOSIS — N202 Calculus of kidney with calculus of ureter: Secondary | ICD-10-CM | POA: Diagnosis present

## 2016-08-02 DIAGNOSIS — M8580 Other specified disorders of bone density and structure, unspecified site: Secondary | ICD-10-CM | POA: Diagnosis not present

## 2016-08-02 DIAGNOSIS — Z86010 Personal history of colonic polyps: Secondary | ICD-10-CM | POA: Insufficient documentation

## 2016-08-02 DIAGNOSIS — F329 Major depressive disorder, single episode, unspecified: Secondary | ICD-10-CM | POA: Diagnosis not present

## 2016-08-02 DIAGNOSIS — N132 Hydronephrosis with renal and ureteral calculous obstruction: Principal | ICD-10-CM | POA: Insufficient documentation

## 2016-08-02 DIAGNOSIS — G4733 Obstructive sleep apnea (adult) (pediatric): Secondary | ICD-10-CM | POA: Diagnosis not present

## 2016-08-02 DIAGNOSIS — K219 Gastro-esophageal reflux disease without esophagitis: Secondary | ICD-10-CM | POA: Insufficient documentation

## 2016-08-02 DIAGNOSIS — N200 Calculus of kidney: Secondary | ICD-10-CM | POA: Insufficient documentation

## 2016-08-02 HISTORY — PX: CYSTOSCOPY WITH RETROGRADE PYELOGRAM, URETEROSCOPY AND STENT PLACEMENT: SHX5789

## 2016-08-02 LAB — URINALYSIS, ROUTINE W REFLEX MICROSCOPIC
Bilirubin Urine: NEGATIVE
Glucose, UA: NEGATIVE mg/dL
Ketones, ur: NEGATIVE mg/dL
Nitrite: NEGATIVE
PH: 5.5 (ref 5.0–8.0)
Protein, ur: NEGATIVE mg/dL
SPECIFIC GRAVITY, URINE: 1.025 (ref 1.005–1.030)

## 2016-08-02 LAB — CBC WITH DIFFERENTIAL/PLATELET
Basophils Absolute: 0 10*3/uL (ref 0.0–0.1)
Basophils Relative: 0 %
EOS ABS: 0 10*3/uL (ref 0.0–0.7)
Eosinophils Relative: 0 %
HEMATOCRIT: 40.4 % (ref 36.0–46.0)
HEMOGLOBIN: 13.3 g/dL (ref 12.0–15.0)
LYMPHS ABS: 1.8 10*3/uL (ref 0.7–4.0)
Lymphocytes Relative: 19 %
MCH: 29.5 pg (ref 26.0–34.0)
MCHC: 32.9 g/dL (ref 30.0–36.0)
MCV: 89.6 fL (ref 78.0–100.0)
MONOS PCT: 7 %
Monocytes Absolute: 0.7 10*3/uL (ref 0.1–1.0)
NEUTROS ABS: 6.9 10*3/uL (ref 1.7–7.7)
NEUTROS PCT: 74 %
Platelets: 273 10*3/uL (ref 150–400)
RBC: 4.51 MIL/uL (ref 3.87–5.11)
RDW: 14 % (ref 11.5–15.5)
WBC: 9.4 10*3/uL (ref 4.0–10.5)

## 2016-08-02 LAB — COMPREHENSIVE METABOLIC PANEL
ALBUMIN: 4.1 g/dL (ref 3.5–5.0)
ALK PHOS: 74 U/L (ref 38–126)
ALT: 23 U/L (ref 14–54)
ANION GAP: 9 (ref 5–15)
AST: 18 U/L (ref 15–41)
BILIRUBIN TOTAL: 0.6 mg/dL (ref 0.3–1.2)
BUN: 12 mg/dL (ref 6–20)
CALCIUM: 9.1 mg/dL (ref 8.9–10.3)
CO2: 26 mmol/L (ref 22–32)
CREATININE: 0.91 mg/dL (ref 0.44–1.00)
Chloride: 104 mmol/L (ref 101–111)
GFR calc non Af Amer: >60 mL/min (ref >60)
GLUCOSE: 143 mg/dL — AB (ref 65–99)
Potassium: 3.7 mmol/L (ref 3.5–5.1)
SODIUM: 139 mmol/L (ref 135–145)
TOTAL PROTEIN: 7.8 g/dL (ref 6.5–8.1)

## 2016-08-02 SURGERY — CYSTOURETEROSCOPY, WITH RETROGRADE PYELOGRAM AND STENT INSERTION
Anesthesia: General | Laterality: Left

## 2016-08-02 MED ORDER — DIPHENHYDRAMINE HCL 12.5 MG/5ML PO ELIX: 12.5000 mg | ORAL_SOLUTION | Freq: Four times a day (QID) | ORAL | Status: DC | PRN

## 2016-08-02 MED ORDER — HYDROMORPHONE HCL 1 MG/ML IJ SOLN: 0.5000 mg | INTRAMUSCULAR | Status: DC | PRN

## 2016-08-02 MED ORDER — ACETAMINOPHEN 325 MG PO TABS: 650.0000 mg | ORAL_TABLET | ORAL | Status: DC | PRN

## 2016-08-02 MED ORDER — SENNOSIDES-DOCUSATE SODIUM 8.6-50 MG PO TABS: 1.0000 | ORAL_TABLET | Freq: Every evening | ORAL | Status: DC | PRN

## 2016-08-02 MED ORDER — ONDANSETRON HCL 4 MG/2ML IJ SOLN
INTRAMUSCULAR | Status: DC | PRN
  Administered 2016-08-02: 4 mg via INTRAVENOUS

## 2016-08-02 MED ORDER — OXYBUTYNIN CHLORIDE 5 MG PO TABS: 5.0000 mg | ORAL_TABLET | Freq: Three times a day (TID) | ORAL | Status: DC | PRN

## 2016-08-02 MED ORDER — OXYCODONE-ACETAMINOPHEN 5-325 MG PO TABS: 1.0000 | ORAL_TABLET | Freq: Four times a day (QID) | ORAL | Status: DC | PRN

## 2016-08-02 MED ORDER — LACTATED RINGERS IV SOLN: INTRAVENOUS | Status: DC

## 2016-08-02 MED ORDER — ONDANSETRON 4 MG PO TBDP: 4.0000 mg | ORAL_TABLET | Freq: Three times a day (TID) | ORAL | Status: DC | PRN

## 2016-08-02 MED ORDER — SODIUM CHLORIDE 0.9 % IR SOLN
Status: DC | PRN
  Administered 2016-08-02: 1000 mL via INTRAVESICAL

## 2016-08-02 MED ORDER — ONDANSETRON HCL 4 MG/2ML IJ SOLN
4.0000 mg | Freq: Once | INTRAMUSCULAR | Status: AC
  Administered 2016-08-02: 4 mg via INTRAVENOUS
  Filled 2016-08-02: qty 2

## 2016-08-02 MED ORDER — FENTANYL CITRATE (PF) 100 MCG/2ML IJ SOLN
INTRAMUSCULAR | Status: DC | PRN
  Administered 2016-08-02: 50 ug via INTRAVENOUS

## 2016-08-02 MED ORDER — FLUOXETINE HCL 20 MG PO TABS
20.0000 mg | ORAL_TABLET | ORAL | Status: DC
  Administered 2016-08-03: 20 mg via ORAL
  Filled 2016-08-02 (×2): qty 1

## 2016-08-02 MED ORDER — CIPROFLOXACIN IN D5W 400 MG/200ML IV SOLN
INTRAVENOUS | Status: AC
  Filled 2016-08-02: qty 200

## 2016-08-02 MED ORDER — SODIUM CHLORIDE 0.9 % IV SOLN
INTRAVENOUS | Status: DC | PRN
  Administered 2016-08-02: 3 mL

## 2016-08-02 MED ORDER — LACTATED RINGERS IV SOLN
INTRAVENOUS | Status: DC | PRN
  Administered 2016-08-02: 20:00:00 via INTRAVENOUS

## 2016-08-02 MED ORDER — LIDOCAINE 2% (20 MG/ML) 5 ML SYRINGE
INTRAMUSCULAR | Status: AC
  Filled 2016-08-02: qty 5

## 2016-08-02 MED ORDER — CIPROFLOXACIN IN D5W 400 MG/200ML IV SOLN
400.0000 mg | Freq: Two times a day (BID) | INTRAVENOUS | Status: DC
  Administered 2016-08-03: 400 mg via INTRAVENOUS
  Filled 2016-08-02: qty 200

## 2016-08-02 MED ORDER — MEPERIDINE HCL 50 MG/ML IJ SOLN: 6.2500 mg | INTRAMUSCULAR | Status: DC | PRN

## 2016-08-02 MED ORDER — ONDANSETRON HCL 4 MG/2ML IJ SOLN
INTRAMUSCULAR | Status: AC
  Filled 2016-08-02: qty 2

## 2016-08-02 MED ORDER — DIPHENHYDRAMINE HCL 50 MG/ML IJ SOLN: 12.5000 mg | Freq: Four times a day (QID) | INTRAMUSCULAR | Status: DC | PRN

## 2016-08-02 MED ORDER — ONDANSETRON HCL 4 MG/2ML IJ SOLN: 4.0000 mg | INTRAMUSCULAR | Status: DC | PRN

## 2016-08-02 MED ORDER — DEXAMETHASONE SODIUM PHOSPHATE 10 MG/ML IJ SOLN
INTRAMUSCULAR | Status: DC | PRN
  Administered 2016-08-02: 10 mg via INTRAVENOUS

## 2016-08-02 MED ORDER — KETOROLAC TROMETHAMINE 30 MG/ML IJ SOLN
30.0000 mg | Freq: Once | INTRAMUSCULAR | Status: AC
  Administered 2016-08-02: 30 mg via INTRAVENOUS
  Filled 2016-08-02: qty 1

## 2016-08-02 MED ORDER — METOCLOPRAMIDE HCL 5 MG/ML IJ SOLN: 10.0000 mg | Freq: Once | INTRAMUSCULAR | Status: DC | PRN

## 2016-08-02 MED ORDER — FENTANYL CITRATE (PF) 100 MCG/2ML IJ SOLN: 25.0000 ug | INTRAMUSCULAR | Status: DC | PRN

## 2016-08-02 MED ORDER — PROPOFOL 10 MG/ML IV BOLUS
INTRAVENOUS | Status: DC | PRN
  Administered 2016-08-02: 180 mg via INTRAVENOUS

## 2016-08-02 MED ORDER — ZOLPIDEM TARTRATE 5 MG PO TABS: 5.0000 mg | ORAL_TABLET | Freq: Every evening | ORAL | Status: DC | PRN

## 2016-08-02 MED ORDER — CIPROFLOXACIN IN D5W 400 MG/200ML IV SOLN
400.0000 mg | INTRAVENOUS | Status: AC
  Administered 2016-08-02: 400 mg via INTRAVENOUS

## 2016-08-02 MED ORDER — HYDROMORPHONE HCL 1 MG/ML IJ SOLN
0.5000 mg | Freq: Once | INTRAMUSCULAR | Status: AC
  Administered 2016-08-02: 0.5 mg via INTRAVENOUS
  Filled 2016-08-02: qty 1

## 2016-08-02 MED ORDER — EPHEDRINE 5 MG/ML INJ
INTRAVENOUS | Status: AC
  Filled 2016-08-02: qty 10

## 2016-08-02 MED ORDER — EPHEDRINE SULFATE-NACL 50-0.9 MG/10ML-% IV SOSY
PREFILLED_SYRINGE | INTRAVENOUS | Status: DC | PRN
  Administered 2016-08-02: 5 mg via INTRAVENOUS
  Administered 2016-08-02: 10 mg via INTRAVENOUS

## 2016-08-02 MED ORDER — FENTANYL CITRATE (PF) 100 MCG/2ML IJ SOLN
INTRAMUSCULAR | Status: AC
  Filled 2016-08-02: qty 2

## 2016-08-02 MED ORDER — PROPOFOL 10 MG/ML IV BOLUS
INTRAVENOUS | Status: AC
  Filled 2016-08-02: qty 20

## 2016-08-02 MED ORDER — KCL IN DEXTROSE-NACL 20-5-0.45 MEQ/L-%-% IV SOLN
INTRAVENOUS | Status: DC
  Administered 2016-08-03: via INTRAVENOUS
  Filled 2016-08-02 (×2): qty 1000

## 2016-08-02 MED ORDER — TAMSULOSIN HCL 0.4 MG PO CAPS
0.4000 mg | ORAL_CAPSULE | Freq: Two times a day (BID) | ORAL | Status: DC
  Administered 2016-08-03: 0.4 mg via ORAL
  Filled 2016-08-02: qty 1

## 2016-08-02 MED ORDER — BISACODYL 10 MG RE SUPP: 10.0000 mg | Freq: Every day | RECTAL | Status: DC | PRN

## 2016-08-02 SURGICAL SUPPLY — 23 items
BAG URO CATCHER STRL LF (MISCELLANEOUS) ×3 IMPLANT
BASKET LASER NITINOL 1.9FR (BASKET) IMPLANT
BASKET STONE NCOMPASS (UROLOGICAL SUPPLIES) IMPLANT
BSKT STON RTRVL 120 1.9FR (BASKET)
CATH URET 5FR 28IN OPEN ENDED (CATHETERS) IMPLANT
CATH URET DUAL LUMEN 6-10FR 50 (CATHETERS) ×3 IMPLANT
CLOTH BEACON ORANGE TIMEOUT ST (SAFETY) ×3 IMPLANT
FIBER LASER FLEXIVA 1000 (UROLOGICAL SUPPLIES) IMPLANT
FIBER LASER FLEXIVA 365 (UROLOGICAL SUPPLIES) IMPLANT
FIBER LASER FLEXIVA 550 (UROLOGICAL SUPPLIES) IMPLANT
GLOVE SURG SS PI 8.0 STRL IVOR (GLOVE) IMPLANT
GOWN STRL REUS W/TWL XL LVL3 (GOWN DISPOSABLE) ×3 IMPLANT
GUIDEWIRE STR DUAL SENSOR (WIRE) ×3 IMPLANT
IV NS 1000ML (IV SOLUTION) ×3
IV NS 1000ML BAXH (IV SOLUTION) ×1 IMPLANT
IV NS IRRIG 3000ML ARTHROMATIC (IV SOLUTION) ×3 IMPLANT
MANIFOLD NEPTUNE II (INSTRUMENTS) ×3 IMPLANT
PACK CYSTO (CUSTOM PROCEDURE TRAY) ×3 IMPLANT
SHEATH ACCESS URETERAL 38CM (SHEATH) ×3 IMPLANT
SHEATH URET ACCESS 10/12FR (MISCELLANEOUS) IMPLANT
STENT URET 6FRX26 CONTOUR (STENTS) ×2 IMPLANT
TUBING CONNECTING 10 (TUBING) ×2 IMPLANT
TUBING CONNECTING 10' (TUBING) ×1

## 2016-08-02 NOTE — ED Provider Notes (Signed)
[facility] DEPT Provider Note   CSN: [id number] Arrival date & time: 08/02/16  1123     History   Chief Complaint Chief Complaint  Patient presents with  . Nephrolithiasis    HPI Nichole Sandoval is a 66 y.o. female who presents with L flank pain. PMH significant for recently diagnosed 5cm kidney stone on 12/8. She was sent home with Flomax, Zofran, and pain medicine and advised to follow up with Urology. She made an appointment and has had 2 office visits in the interim. Urologist is Dr. Jeffie Pollock. Both visits she was told that the stone had not moved - Last appt was on Wed. Three days ago she started to have increased pain in the L flank which radiated to the left lower abdomen. It is constant, sharp/stabbing. She endorses chills and has N/V due to the pain medicine. She denies fever, chest pain, SOB, right sided pain, urinary symptoms, vaginal symptoms. She has been tolerating PO. She is here today because the pain medicine said take every 6 hours however she was not able to wait that long and has been taking it every 4. She still has pain medicine left over. Pain this morning was an 8. She states it is now a 3 after taking Oxycodone but is coming back. Plan was to remove stone in February if it still hadn't moved.  HPI  Past Medical History:  Diagnosis Date  . Adenomatous colon polyp 1993  . Depression   . 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 GERD (gastroesophageal reflux disease)   . History of kidney stones   . Hyperlipidemia   . OSA (obstructive sleep apnea)   . Osteoarthritis   . Osteopenia   . Sleep apnea    Cpap    There are no active problems to display for this patient.   Past Surgical History:  Procedure Laterality Date  . APPENDECTOMY    . CESAREAN SECTION     x2  . COLONOSCOPY    . KNEE ARTHROSCOPY    . SHOULDER SURGERY    . VAGINAL HYSTERECTOMY      OB History    No data available       Home Medications    Prior to Admission  medications   Medication Sig Start Date End Date Taking? Authorizing Provider  FLUoxetine (PROZAC) 20 MG tablet Take 20 mg by mouth 3 (three) times a week.    Historical Provider, MD  ondansetron (ZOFRAN-ODT) 4 MG disintegrating tablet 4mg  ODT q6 hours prn nausea or before taking pain medication 07/03/16   Jarrett Soho Muthersbaugh, PA-C  oxyCODONE-acetaminophen (PERCOCET) 5-325 MG tablet Take 1 tablet by mouth every 6 (six) hours as needed for moderate pain. 07/03/16   Hannah Muthersbaugh, PA-C  tamsulosin (FLOMAX) 0.4 MG CAPS capsule Take 1 capsule (0.4 mg total) by mouth 2 (two) times daily. 07/03/16   Jarrett Soho Muthersbaugh, PA-C    Family History Family History  Problem Relation Age of Onset  . Rectal cancer Paternal Grandmother 3    lived to be  age 7  . Parkinson's disease Father   . Colon cancer Neg Hx     Social History Social History  Substance Use Topics  . Smoking status: Never Smoker  . Smokeless tobacco: Never Used  . Alcohol use No     Allergies   Augmentin [amoxicillin-pot clavulanate]   Review of Systems Review of Systems  Constitutional: Positive for chills. Negative for appetite change and fever.  Respiratory: Negative for  shortness of breath.   Cardiovascular: Negative for chest pain.  Gastrointestinal: Positive for abdominal pain, nausea and vomiting. Negative for constipation and diarrhea.  Genitourinary: Positive for flank pain. Negative for dysuria, frequency, hematuria, vaginal bleeding and vaginal discharge.  All other systems reviewed and are negative.    Physical Exam Updated Vital Signs BP 142/70 (BP Location: Left Arm)   Pulse 76   Temp 97.5 F (36.4 C) (Oral)   Resp 18   SpO2 96%   Physical Exam  Constitutional: She is oriented to person, place, and time. She appears well-developed and well-nourished. No distress.  HENT:  Head: Normocephalic and atraumatic.  Eyes: Conjunctivae are normal. Pupils are equal, round, and reactive to light. Right  eye exhibits no discharge. Left eye exhibits no discharge. No scleral icterus.  Neck: Normal range of motion.  Cardiovascular: Normal rate and regular rhythm.  Exam reveals no gallop and no friction rub.   No murmur heard. Pulmonary/Chest: Effort normal and breath sounds normal. No respiratory distress. She has no wheezes. She has no rales. She exhibits no tenderness.  Abdominal: Soft. Bowel sounds are normal. She exhibits no distension and no mass. There is tenderness. There is no rebound and no guarding. No hernia.  L CVA tenderness and LLQ and suprapubic tenderness.  Neurological: She is alert and oriented to person, place, and time.  Skin: Skin is warm and dry.  Psychiatric: She has a normal mood and affect. Her behavior is normal.  Nursing note and vitals reviewed.    ED Treatments / Results  Labs (all labs ordered are listed, but only abnormal results are displayed) Labs Reviewed  URINALYSIS, ROUTINE W REFLEX MICROSCOPIC - Abnormal; Notable for the following:       Result Value   Hgb urine dipstick MODERATE (*)    Leukocytes, UA TRACE (*)    Bacteria, UA RARE (*)    Squamous Epithelial / LPF 6-30 (*)    All other components within normal limits  CBC WITH DIFFERENTIAL/PLATELET  COMPREHENSIVE METABOLIC PANEL    EKG  EKG Interpretation None       Radiology No results found.  Procedures Procedures (including critical care time)  Medications Ordered in ED Medications  ketorolac (TORADOL) 30 MG/ML injection 30 mg (30 mg Intravenous Given 08/02/16 1608)  HYDROmorphone (DILAUDID) injection 0.5 mg (0.5 mg Intravenous Given 08/02/16 1607)  ondansetron (ZOFRAN) injection 4 mg (4 mg Intravenous Given 08/02/16 1605)  ciprofloxacin (CIPRO) IVPB 400 mg (400 mg Intravenous Given 08/02/16 1950)     Initial Impression / Assessment and Plan / ED Course  I have reviewed the triage vital signs and the nursing notes.  Pertinent labs & imaging results that were available during my care  of the patient were reviewed by me and considered in my medical decision making (see chart for details).  Clinical Course    66 year old female with intractable pain due to kidney stone. Vitals are normal. Labs are unremarkable. UA appears clean. KUB does not clearly show stone. Pain managed in ED.   Spoke with Dr. Jeffie Pollock who believes pt will need to be admitted to have stone removed. Will admit. Appreciate assistance.   Final Clinical Impressions(s) / ED Diagnoses   Final diagnoses:  Left ureteral stone    New Prescriptions New Prescriptions   No medications on file     Recardo Evangelist, PA-C 08/04/16 [place], MD 08/07/16 1346

## 2016-08-02 NOTE — Brief Op Note (Signed)
08/02/2016  8:20 PM  PATIENT:  Nichole Sandoval  66 y.o. female  PRE-OPERATIVE DIAGNOSIS:  left ureteral stone  POST-OPERATIVE DIAGNOSIS:  Left ureteral stone with pyonephrosis.   PROCEDURE:  Procedure(s): CYSTOSCOPY WITH RETROGRADE PYELOGRAM,  AND STENT PLACEMENT (Left)  SURGEON:  Surgeon(s) and Role:    * Irine Seal, MD - Primary  PHYSICIAN ASSISTANT:   ASSISTANTS: none   ANESTHESIA:   general  EBL:  No intake/output data recorded.  BLOOD ADMINISTERED:none  DRAINS: left 6 x 26 JJ stent   LOCAL MEDICATIONS USED:  NONE  SPECIMEN:  Source of Specimen:  urine culture.   DISPOSITION OF SPECIMEN:  PATHOLOGY  COUNTS:  YES  TOURNIQUET:  * No tourniquets in log *  DICTATION: .Other Dictation: Dictation Number [id number]  PLAN OF CARE: Admit for overnight observation  PATIENT DISPOSITION:  PACU - hemodynamically stable.   Delay start of Pharmacological VTE agent (>24hrs) due to surgical blood loss or risk of bleeding: not applicable

## 2016-08-02 NOTE — ED Triage Notes (Signed)
Pt dx with 73mm left kidney stone in December.  On 3rd pt was told it had not moved. Starting on 4th, pain started again.  Pt states pain is too much now.  N/V

## 2016-08-02 NOTE — Anesthesia Procedure Notes (Signed)
Procedure Name: LMA Insertion Date/Time: 08/02/2016 8:04 PM Performed by: Montel Clock Pre-anesthesia Checklist: Patient identified, Emergency Drugs available, Suction available, Patient being monitored and Timeout performed Patient Re-evaluated:Patient Re-evaluated prior to inductionOxygen Delivery Method: Circle system utilized Preoxygenation: Pre-oxygenation with 100% oxygen Intubation Type: IV induction Ventilation: Mask ventilation without difficulty LMA: LMA with gastric port inserted LMA Size: 4.0 Number of attempts: 1 Dental Injury: Teeth and Oropharynx as per pre-operative assessment

## 2016-08-02 NOTE — H&P (Signed)
CC: I have ureteral stone.  HPI: Nichole Sandoval is a 66 year-old female patient who was referred by Dr. Jeffrey Caporossi, MD who is here for ureteral stone.  The problem is on the left side. She first stated noticing pain on 07/02/2016. This is not her first kidney stone. She is currently having flank pain, nausea, and vomiting. She denies having back pain, groin pain, fever, and chills. Pain is occuring on the left side. She has not caught a stone in her urine strainer since her symptoms began.   She has never had surgical treatment for calculi in the past.   Nichole Sandoval is a former patient of Dr. Nesi who had the onset last week of left flank pain. the pain was severe and associated with N/V. She had no hematuria or voiding complaints. A CT in the ER showed a 5mm left proximal stone. It was 420HU on the scan. She last had pain on Friday morning but hasn't seen anything pass. She is constipated.   Interval 08/02/16:  [name] presented to the ER today with intractable pain.   A KUB suggests the stone remains between L3 and L4 on the left.      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  Light Drinker.  Drinks 1 caffeinated drink per day. 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     Notes: 1 son, 1 daughter    REVIEW OF SYSTEMS:    GU Review Female:   Patient denies frequent urination, hard to postpone urination, burning /pain with urination, get up at night to urinate, leakage of urine, stream starts and stops, trouble starting your stream, have to strain to urinate, and currently pregnant.  Gastrointestinal (Upper):   Patient reports vomiting and nausea. Patient denies indigestion/ heartburn.  Gastrointestinal (Lower):   Patient reports constipation. Patient denies diarrhea.  Constitutional:   Patient denies fever, night sweats, weight loss, and fatigue.  Skin:   Patient denies skin rash/ lesion and itching.  Eyes:   Patient denies blurred vision and double vision.  Ears/ Nose/ Throat:   Patient denies sore throat and sinus problems.  Hematologic/Lymphatic:   Patient denies swollen glands and easy bruising.  Cardiovascular:   Patient denies leg swelling and chest pains.  Respiratory:   Patient reports cough. Patient denies shortness of breath.  Endocrine:   Patient denies excessive thirst.  Musculoskeletal:   Patient denies back pain and joint pain.  Neurological:   Patient denies headaches and dizziness.  Psychologic:   Patient denies depression and anxiety.   Notes: Writes romance novels.   VITAL SIGNS:      07/09/2016 11:09   AM  Weight 220 lb / 99.79 kg  Height 67 in / 170.18 cm  BP 145/82 mmHg  Pulse 98 /min  Temperature 97.9 F / 37 C  BMI 34.5 kg/m   MULTI-SYSTEM PHYSICAL EXAMINATION:    Constitutional: Well-nourished. No physical deformities. Normally developed. Good grooming.  Neck: Neck symmetrical, not swollen. Normal tracheal position.  Respiratory: No labored breathing, no use of accessory muscles. CTA  Cardiovascular: Normal temperature, RRR without  murmur.  Lymphatic: No enlargement, no tenderness of axillae supraclavicular, neck lymph nodes.  Skin: No paleness, no jaundice, no cyanosis. No lesion, no ulcer, no rash.  Neurologic / Psychiatric: Oriented to time, oriented to place, oriented to person. No depression, no anxiety, no agitation.  Gastrointestinal: Obese abdomen. No mass, no tenderness, no rigidity.   Musculoskeletal: Normal gait and station of head and neck.     PAST DATA REVIEWED:  Source Of History:  Patient  Urine Test Review:   Urinalysis  X-Ray Review: C.T. Stone Protocol: Reviewed Films. 68m left proximal stone    PROCEDURES:         KUB - 74000  A single view of the abdomen is obtained. There is a 3x546mstone over the lower edge of the left L3 transverse process. She has some degenerative lumbar disease with mild scoliosis. There are no gas or soft tissue abnormalities.       Left proximal stone has not changed since CT.    KUB on 08/02/16 shows no significant change in the stone location.      ASSESSMENT:      ICD-10 Details  1 GU:   Calculus Ureter - N20.1    PLAN:            Medications New Meds: Tamsulosin Hcl 0.4 mg capsule, ext release 24 hr 1 capsule PO Daily   #30  1 Refill(s)            Orders X-Rays: KUB          Schedule Labs: 3 Weeks - Urinalysis  X-Rays: 3 Weeks - KUB  Return Visit: 3 Weeks - Office Visit, Extender          Document Letter(s):  Created for Patient: Clinical Summary         Notes:   She has a left proximal stone that is 3x5m10mnd hasn't moved in the last week but she has been pain free.   I discussed the options including continued MET, ESWL and ureteroscopy and she wants to try to pass the stone. I will refill the tamsulosin and she will return in 3 weeks for a KUB.   08/02/16  I will proceed with cystoscopy, left RTG and ureteroscopy with stent today.

## 2016-08-02 NOTE — Anesthesia Preprocedure Evaluation (Signed)
Anesthesia Evaluation  Patient identified by MRN, date of birth, ID band Patient awake    Reviewed: Allergy & Precautions, NPO status , Patient's Chart, lab work & pertinent test results  Airway Mallampati: II  TM Distance: >3 FB Neck ROM: Full    Dental no notable dental hx.    Pulmonary neg pulmonary ROS, sleep apnea and Continuous Positive Airway Pressure Ventilation ,    Pulmonary exam normal breath sounds clear to auscultation       Cardiovascular negative cardio ROS Normal cardiovascular exam Rhythm:Regular Rate:Normal     Neuro/Psych negative neurological ROS  negative psych ROS   GI/Hepatic negative GI ROS, Neg liver ROS, neg GERD  ,  Endo/Other  negative endocrine ROS  Renal/GU negative Renal ROS  negative genitourinary   Musculoskeletal negative musculoskeletal ROS (+)   Abdominal   Peds negative pediatric ROS (+)  Hematology negative hematology ROS (+)   Anesthesia Other Findings   Reproductive/Obstetrics negative OB ROS                             Anesthesia Physical Anesthesia Plan  ASA: II  Anesthesia Plan: General   Post-op Pain Management:    Induction: Intravenous  Airway Management Planned: LMA  Additional Equipment:   Intra-op Plan:   Post-operative Plan: Extubation in OR  Informed Consent: I have reviewed the patients History and Physical, chart, labs and discussed the procedure including the risks, benefits and alternatives for the proposed anesthesia with the patient or authorized representative who has indicated his/her understanding and acceptance.   Dental advisory given  Plan Discussed with: CRNA  Anesthesia Plan Comments:         Anesthesia Quick Evaluation

## 2016-08-02 NOTE — Transfer of Care (Signed)
Immediate Anesthesia Transfer of Care Note  Patient: Nichole Sandoval  Procedure(s) Performed: Procedure(s): CYSTOSCOPY WITH RETROGRADE PYELOGRAM, URETEROSCOPY AND STENT PLACEMENT (Left)  Patient Location: PACU  Anesthesia Type:General  Level of Consciousness:  sedated, patient cooperative and responds to stimulation  Airway & Oxygen Therapy:Patient Spontanous Breathing and Patient connected to face mask oxgen  Post-op Assessment:  Report given to PACU RN and Post -op Vital signs reviewed and stable  Post vital signs:  Reviewed and stable  Last Vitals:  Vitals:   08/02/16 1612 08/02/16 1821  BP: 154/74 149/74  Pulse: 67 62  Resp: 14 14  Temp: 123XX123 C     Complications: No apparent anesthesia complications

## 2016-08-03 ENCOUNTER — Encounter (HOSPITAL_COMMUNITY): Payer: Self-pay | Admitting: Urology

## 2016-08-03 MED ORDER — PHENAZOPYRIDINE HCL 200 MG PO TABS: 200.0000 mg | ORAL_TABLET | Freq: Three times a day (TID) | ORAL | 1 refills | Status: DC | PRN

## 2016-08-03 MED ORDER — ONDANSETRON 4 MG PO TBDP: 4.0000 mg | ORAL_TABLET | Freq: Three times a day (TID) | ORAL | 0 refills | Status: DC | PRN

## 2016-08-03 MED ORDER — CIPROFLOXACIN HCL 500 MG PO TABS: 500.0000 mg | ORAL_TABLET | Freq: Two times a day (BID) | ORAL | 0 refills | Status: DC

## 2016-08-03 NOTE — Discharge Summary (Signed)
Physician Discharge Summary  Patient ID: Nichole Sandoval MRN: [id number] DOB/AGE: Jul 12, 1951 66 y.o.  Admit date: 08/02/2016 Discharge date: 08/03/2016  Admission Diagnoses:  Left ureteral stone  Discharge Diagnoses:  Principal Problem:   Left ureteral stone   Past Medical History:  Diagnosis Date  . Adenomatous colon polyp 1993  . Depression   . 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 GERD (gastroesophageal reflux disease)   . History of kidney stones   . Hyperlipidemia   . OSA (obstructive sleep apnea)   . Osteoarthritis   . Osteopenia   . Sleep apnea    Cpap    Surgeries: Procedure(s): CYSTOSCOPY WITH RETROGRADE PYELOGRAM, URETEROSCOPY AND STENT PLACEMENT on 08/02/2016   Consultants (if any):   Discharged Condition: Improved  Hospital Course: Nichole Sandoval is an 66 y.o. female who was admitted 08/02/2016 with a diagnosis of Left ureteral stone and went to the operating room on 08/02/2016 and underwent the above named procedures.  She had a delicate ureter and possible pyonephrosis so I placed a stent and will schedule ureteroscopy in 10-14 days.   A urine culture was obtained.   She remains afebrile and is ready for discharge.    She was given perioperative antibiotics:  Anti-infectives    Start     Dose/Rate Route Frequency Ordered Stop   08/03/16 0000  ciprofloxacin (CIPRO) 500 MG tablet     500 mg Oral 2 times daily 08/03/16 0748     08/02/16 2200  ciprofloxacin (CIPRO) IVPB 400 mg     400 mg 200 mL/hr over 60 Minutes Intravenous Every 12 hours 08/02/16 2146     08/02/16 1825  ciprofloxacin (CIPRO) IVPB 400 mg     400 mg 200 mL/hr over 60 Minutes Intravenous 60 min pre-op 08/02/16 1825 08/02/16 2050    .  She was given sequential compression devices for DVT prophylaxis.  She benefited maximally from the hospital stay and there were no complications.    Recent vital signs:  Vitals:   08/02/16 2135 08/03/16 0430  BP: (!) 165/84 133/73  Pulse:  74 69  Resp: 18 16  Temp: 98 F (36.7 C) 97.9 F (36.6 C)    Recent laboratory studies:  Lab Results  Component Value Date   HGB 13.3 08/02/2016   HGB 13.8 07/03/2016   HGB 15.3 (H) 10/13/2010   Lab Results  Component Value Date   WBC 9.4 08/02/2016   PLT 273 08/02/2016   No results found for: INR Lab Results  Component Value Date   NA 139 08/02/2016   K 3.7 08/02/2016   CL 104 08/02/2016   CO2 26 08/02/2016   BUN 12 08/02/2016   CREATININE 0.91 08/02/2016   GLUCOSE 143 (H) 08/02/2016    Discharge Medications:   Allergies as of 08/03/2016      Reactions   Augmentin [amoxicillin-pot Clavulanate] Hypertension   Has patient had a PCN reaction causing immediate rash, facial/tongue/throat swelling, SOB or lightheadedness with hypotension: No Has patient had a PCN reaction causing severe rash involving mucus membranes or skin necrosis: No Has patient had a PCN reaction that required hospitalization No Has patient had a PCN reaction occurring within the last 10 years: Yes If all of the above answers are "NO", then may proceed with Cephalosporin use.      Medication List    TAKE these medications   ciprofloxacin 500 MG tablet Commonly known as:  CIPRO Take 1 tablet (500 mg total) by  mouth 2 (two) times daily.   FLUoxetine 20 MG tablet Commonly known as:  PROZAC Take 20 mg by mouth 3 (three) times a week. Mondays, Wednesdays, Friday   ondansetron 4 MG disintegrating tablet Commonly known as:  ZOFRAN-ODT Take 1 tablet (4 mg total) by mouth every 8 (eight) hours as needed for nausea or vomiting. 4mg  ODT q6 hours prn nausea or before taking pain medication What changed:  how much to take  how to take this  when to take this  reasons to take this   oxyCODONE-acetaminophen 5-325 MG tablet Commonly known as:  PERCOCET Take 1 tablet by mouth every 6 (six) hours as needed for moderate pain.   phenazopyridine 200 MG tablet Commonly known as:  PYRIDIUM Take 1 tablet  (200 mg total) by mouth 3 (three) times daily as needed for pain.   prenatal multivitamin Tabs tablet Take 1 tablet by mouth daily at 12 noon.   tamsulosin 0.4 MG Caps capsule Commonly known as:  FLOMAX Take 1 capsule (0.4 mg total) by mouth 2 (two) times daily.       Diagnostic Studies: Dg Abdomen 1 View  Result Date: 08/02/2016 CLINICAL DATA:  Left flank pain with nausea and vomiting for 3 days EXAM: ABDOMEN - 1 VIEW COMPARISON:  July 29, 2016 FINDINGS: Small phleboliths in the left pelvis appear stable. A previously noted 4 mm calculus in the upper left kidney is not seen at this time. No new calcifications are evident. There is moderate stool throughout colon. No bowel dilatation or air-fluid level suggesting bowel obstruction. No free air. There is lumbar levoscoliosis. IMPRESSION: Previously noted 4 mm calculus left kidney no longer evident. There is stool in this area which potentially could obscure this calculus to radiography. Stable presumed phleboliths in the pelvis. No bowel obstruction or free air. Electronically Signed   By: Lowella Grip III M.D.   On: 08/02/2016 17:53    Disposition: 01-Home or Self Care  Discharge Instructions    Diet - low sodium heart healthy    Complete by:  As directed    Diet general    Complete by:  As directed    Discontinue IV    Complete by:  As directed    Increase activity slowly    Complete by:  As directed       Follow-up Information    Malka So, MD Follow up.   Specialty:  Urology Why:  My office will call to arrange your next procedure.  Contact information: [address] [place] [address] [phone]            Signed: Malka So 08/03/2016, 7:51 AM

## 2016-08-03 NOTE — Discharge Instructions (Signed)
Ureteral Stent Implantation, Care After °Introduction °Refer to this sheet in the next few weeks. These instructions provide you with information about caring for yourself after your procedure. Your health care provider may also give you more specific instructions. Your treatment has been planned according to current medical practices, but problems sometimes occur. Call your health care provider if you have any problems or questions after your procedure. °What can I expect after the procedure? °After the procedure, it is common to have: °· Nausea. °· Mild pain when you urinate. You may feel this pain in your lower back or lower abdomen. Pain should stop within a few minutes after you urinate. This may last for up to 1 week. °· A small amount of blood in your urine for several days. °Follow these instructions at home: °  °Medicines °· Take over-the-counter and prescription medicines only as told by your health care provider. °· If you were prescribed an antibiotic medicine, take it as told by your health care provider. Do not stop taking the antibiotic even if you start to feel better. °· Do not drive for 24 hours if you received a sedative. °· Do not drive or operate heavy machinery while taking prescription pain medicines. °Activity °· Return to your normal activities as told by your health care provider. Ask your health care provider what activities are safe for you. °· Do not lift anything that is heavier than 10 lb (4.5 kg). Follow this limit for 1 week after your procedure, or for as long as told by your health care provider. °General instructions °· Watch for any blood in your urine. Call your health care provider if the amount of blood in your urine increases. °· If you have a catheter: °¨ Follow instructions from your health care provider about taking care of your catheter and collection bag. °¨ Do not take baths, swim, or use a hot tub until your health care provider approves. °· Drink enough fluid to keep  your urine clear or pale yellow. °· Keep all follow-up visits as told by your health care provider. This is important. °Contact a health care provider if: °· You have pain that gets worse or does not get better with medicine, especially pain when you urinate. °· You have difficulty urinating. °· You feel nauseous or you vomit repeatedly during a period of more than 2 days after the procedure. °Get help right away if: °· Your urine is dark red or has blood clots in it. °· You are leaking urine (have incontinence). °· The end of the stent comes out of your urethra. °· You cannot urinate. °· You have sudden, sharp, or severe pain in your abdomen or lower back. °· You have a fever. °This information is not intended to replace advice given to you by your health care provider. Make sure you discuss any questions you have with your health care provider. °Document Released: 03/15/2013 Document Revised: 12/19/2015 Document Reviewed: 01/25/2015 °© 2017 Elsevier ° °

## 2016-08-03 NOTE — Op Note (Signed)
NAMEVERNAMAE, CRIPE             ACCOUNT NO.:  [id number]  MEDICAL RECORD NO.:  [id number]  LOCATION:  [id number]                         FACILITY:  [facility]  PHYSICIAN:  Marshall Cork. Jeffie Pollock, M.D.    DATE OF BIRTH:  1951/02/15  DATE OF PROCEDURE:  08/02/2016 DATE OF DISCHARGE:                              OPERATIVE REPORT   Patient of Dr. Irine Seal.  PROCEDURE:  Cystoscopy with left retrograde pyelogram and insertion of left double-J stent.  PREOPERATIVE DIAGNOSIS:  Left proximal ureteral stone.  POSTOPERATIVE DIAGNOSIS:  Left proximal ureteral stone with pyonephrosis.  SURGEON:  Irine Seal, MD.  ANESTHESIA:  General.  SPECIMEN:  Urine culture.  DRAINS:  A 6-French x 26 cm left double-J stent.  BLOOD LOSS:  None.  COMPLICATIONS:  None.  INDICATIONS:  Ms. Nichole Sandoval is a 66 year old white female, who was seen in our office twice in December with a left proximal ureteral stone. She has had persistent problems with pain and the stone does not appear to progress.  She presented to the emergency room today and a KUB suggested the stone was at L3-L4 on the left, which was essentially unchanged from her prior studies earlier in December.  Her pain was not well controlled.  It was felt that cystoscopy with retrograde pyelogram, placement, stenting, and possible ureteroscopy were indicated.  FINDINGS AND PROCEDURE:  She was taken to the operating room, where a general anesthetic was induced.  She was given Cipro.  She was placed in lithotomy position and was fitted with PAS hose.  Her perineum and genitalia were prepped with Betadine solution.  She was draped in usual sterile fashion.  Cystoscopy was performed using the 23-French scope and 30-degree lens. Examination revealed a normal urethra.  The bladder wall was smooth and pale without tumor, stones, or inflammation.  The ureteral orifices were unremarkable.  The left ureteral orifice was cannulated with a 5-French  open-end catheter and contrast was instilled.  Left retrograde pyelogram revealed a delicate ureter up to a filling defect in the proximal ureter.  Contrast went by with, but required moderate pressure to reflux to the kidney.  Once the location of stone was identified, a Sensor guidewire was passed through the open-end catheter by the stone to the kidney.  There was some resistance at the level of the stone.  Once the wire was past the stone, observation of the ureteral orifice revealed efflux of fairly purulent urine, which raised concern for pyonephrosis.  At this point, particularly with the finding of a very delicate ureter, it was felt that insertion of a double-J stent was indicated and that ureteroscopy would need to be delayed to a later date.  A 6-French 26 cm Contour double-J stent was inserted over the wire to the kidney.  The wire was removed leaving good coil in the kidney, a good coil in the bladder.  The bladder was then drained, but the specimen was collected to be sent for culture.  She was taken down from lithotomy position.  Her anesthetic was reversed.  She was moved to recovery room in stable condition.  There were no complications.     Marshall Cork. Jeffie Pollock, M.D.  JJW/MEDQ  D:  08/02/2016  T:  08/03/2016  Job:  [id number]

## 2016-08-04 ENCOUNTER — Other Ambulatory Visit: Payer: Self-pay | Admitting: Urology

## 2016-08-04 LAB — URINE CULTURE: Culture: NO GROWTH

## 2016-08-04 NOTE — Progress Notes (Signed)
Scheduling pre op appt- please place SURGICAL ORDERS IN EPIC  Thanks

## 2016-08-04 NOTE — Anesthesia Postprocedure Evaluation (Signed)
Anesthesia Post Note  Patient: Nichole Sandoval  Procedure(s) Performed: Procedure(s) (LRB): CYSTOSCOPY WITH RETROGRADE PYELOGRAM, URETEROSCOPY AND STENT PLACEMENT (Left)  Patient location during evaluation: PACU Anesthesia Type: General Level of consciousness: awake and alert Pain management: pain level controlled Vital Signs Assessment: post-procedure vital signs reviewed and stable Respiratory status: spontaneous breathing, nonlabored ventilation, respiratory function stable and patient connected to nasal cannula oxygen Cardiovascular status: blood pressure returned to baseline and stable Postop Assessment: no signs of nausea or vomiting Anesthetic complications: no        Last Vitals:  Vitals:   08/02/16 2135 08/03/16 0430  BP: (!) 165/84 133/73  Pulse: 74 69  Resp: 18 16  Temp: 36.7 C 36.6 C    Last Pain:  Vitals:   08/03/16 0840  TempSrc:   PainSc: 0-No pain   Pain Goal: Patients Stated Pain Goal: 3 (08/03/16 0840)               Montez Hageman

## 2016-08-06 ENCOUNTER — Encounter (HOSPITAL_COMMUNITY): Payer: Self-pay | Admitting: *Deleted

## 2016-08-18 ENCOUNTER — Encounter (HOSPITAL_COMMUNITY)
Admission: RE | Disposition: A | Discharge status: Home / Self Care | Payer: Self-pay | Source: Ambulatory Visit | Attending: Urology

## 2016-08-18 ENCOUNTER — Ambulatory Visit (HOSPITAL_COMMUNITY): Payer: BC Managed Care – PPO | Admitting: Anesthesiology

## 2016-08-18 ENCOUNTER — Encounter (HOSPITAL_COMMUNITY): Payer: Self-pay | Admitting: *Deleted

## 2016-08-18 ENCOUNTER — Ambulatory Visit (HOSPITAL_COMMUNITY)
Admission: RE | Admit: 2016-08-18 | Discharge: 2016-08-18 | Disposition: A | Discharge status: Home / Self Care | Payer: BC Managed Care – PPO | Source: Ambulatory Visit | Attending: Urology | Admitting: Urology

## 2016-08-18 DIAGNOSIS — M1990 Unspecified osteoarthritis, unspecified site: Secondary | ICD-10-CM | POA: Diagnosis not present

## 2016-08-18 DIAGNOSIS — Z87442 Personal history of urinary calculi: Secondary | ICD-10-CM | POA: Insufficient documentation

## 2016-08-18 DIAGNOSIS — Z820 Family history of epilepsy and other diseases of the nervous system: Secondary | ICD-10-CM | POA: Insufficient documentation

## 2016-08-18 DIAGNOSIS — Z6834 Body mass index (BMI) 34.0-34.9, adult: Secondary | ICD-10-CM | POA: Insufficient documentation

## 2016-08-18 DIAGNOSIS — Q819 Epidermolysis bullosa, unspecified: Secondary | ICD-10-CM | POA: Diagnosis not present

## 2016-08-18 DIAGNOSIS — N132 Hydronephrosis with renal and ureteral calculous obstruction: Secondary | ICD-10-CM | POA: Insufficient documentation

## 2016-08-18 DIAGNOSIS — K5900 Constipation, unspecified: Secondary | ICD-10-CM | POA: Diagnosis not present

## 2016-08-18 DIAGNOSIS — Z79899 Other long term (current) drug therapy: Secondary | ICD-10-CM | POA: Diagnosis not present

## 2016-08-18 DIAGNOSIS — Z841 Family history of disorders of kidney and ureter: Secondary | ICD-10-CM | POA: Diagnosis not present

## 2016-08-18 DIAGNOSIS — Z881 Allergy status to other antibiotic agents status: Secondary | ICD-10-CM | POA: Diagnosis not present

## 2016-08-18 DIAGNOSIS — K219 Gastro-esophageal reflux disease without esophagitis: Secondary | ICD-10-CM | POA: Insufficient documentation

## 2016-08-18 DIAGNOSIS — Z90710 Acquired absence of both cervix and uterus: Secondary | ICD-10-CM | POA: Insufficient documentation

## 2016-08-18 DIAGNOSIS — G4730 Sleep apnea, unspecified: Secondary | ICD-10-CM | POA: Diagnosis not present

## 2016-08-18 DIAGNOSIS — N201 Calculus of ureter: Secondary | ICD-10-CM | POA: Diagnosis present

## 2016-08-18 HISTORY — DX: Family history of other specified conditions: Z84.89

## 2016-08-18 HISTORY — PX: URETEROSCOPY WITH HOLMIUM LASER LITHOTRIPSY: SHX6645

## 2016-08-18 HISTORY — PX: HOLMIUM LASER APPLICATION: SHX5852

## 2016-08-18 SURGERY — URETEROSCOPY, WITH LITHOTRIPSY USING HOLMIUM LASER
Anesthesia: General | Laterality: Left

## 2016-08-18 MED ORDER — ONDANSETRON HCL 4 MG/2ML IJ SOLN
INTRAMUSCULAR | Status: DC | PRN
  Administered 2016-08-18: 4 mg via INTRAVENOUS

## 2016-08-18 MED ORDER — FENTANYL CITRATE (PF) 100 MCG/2ML IJ SOLN
INTRAMUSCULAR | Status: AC
  Filled 2016-08-18: qty 2

## 2016-08-18 MED ORDER — MEPERIDINE HCL 50 MG/ML IJ SOLN: 6.2500 mg | INTRAMUSCULAR | Status: DC | PRN

## 2016-08-18 MED ORDER — PROPOFOL 10 MG/ML IV BOLUS
INTRAVENOUS | Status: DC | PRN
  Administered 2016-08-18: 200 mg via INTRAVENOUS

## 2016-08-18 MED ORDER — LIDOCAINE 2% (20 MG/ML) 5 ML SYRINGE
INTRAMUSCULAR | Status: AC
  Filled 2016-08-18: qty 5

## 2016-08-18 MED ORDER — IOPAMIDOL (ISOVUE-300) INJECTION 61%: INTRAVENOUS | Status: DC | PRN

## 2016-08-18 MED ORDER — MIDAZOLAM HCL 2 MG/2ML IJ SOLN: 0.5000 mg | Freq: Once | INTRAMUSCULAR | Status: DC | PRN

## 2016-08-18 MED ORDER — DEXMEDETOMIDINE HCL IN NACL 200 MCG/50ML IV SOLN
INTRAVENOUS | Status: AC
Start: 2016-08-18 — End: 2016-08-18
  Filled 2016-08-18: qty 50

## 2016-08-18 MED ORDER — DEXAMETHASONE SODIUM PHOSPHATE 10 MG/ML IJ SOLN
INTRAMUSCULAR | Status: DC | PRN
  Administered 2016-08-18: 10 mg via INTRAVENOUS

## 2016-08-18 MED ORDER — LACTATED RINGERS IV SOLN
INTRAVENOUS | Status: DC
  Administered 2016-08-18 (×2): via INTRAVENOUS

## 2016-08-18 MED ORDER — PROPOFOL 10 MG/ML IV BOLUS
INTRAVENOUS | Status: AC
  Filled 2016-08-18: qty 20

## 2016-08-18 MED ORDER — MIDAZOLAM HCL 2 MG/2ML IJ SOLN
INTRAMUSCULAR | Status: AC
  Filled 2016-08-18: qty 2

## 2016-08-18 MED ORDER — SUGAMMADEX SODIUM 500 MG/5ML IV SOLN
INTRAVENOUS | Status: DC | PRN
  Administered 2016-08-18: 500 mg via INTRAVENOUS

## 2016-08-18 MED ORDER — CIPROFLOXACIN IN D5W 400 MG/200ML IV SOLN
400.0000 mg | INTRAVENOUS | Status: AC
  Administered 2016-08-18: 400 mg via INTRAVENOUS

## 2016-08-18 MED ORDER — SODIUM CHLORIDE 0.9 % IR SOLN
Status: DC | PRN
  Administered 2016-08-18: 3000 mL via INTRAVESICAL

## 2016-08-18 MED ORDER — SUGAMMADEX SODIUM 200 MG/2ML IV SOLN
INTRAVENOUS | Status: AC
  Filled 2016-08-18: qty 2

## 2016-08-18 MED ORDER — CIPROFLOXACIN IN D5W 400 MG/200ML IV SOLN
INTRAVENOUS | Status: AC
  Filled 2016-08-18: qty 200

## 2016-08-18 MED ORDER — MIDAZOLAM HCL 5 MG/5ML IJ SOLN
INTRAMUSCULAR | Status: DC | PRN
  Administered 2016-08-18: 2 mg via INTRAVENOUS

## 2016-08-18 MED ORDER — FENTANYL CITRATE (PF) 100 MCG/2ML IJ SOLN
INTRAMUSCULAR | Status: DC | PRN
  Administered 2016-08-18: 100 ug via INTRAVENOUS

## 2016-08-18 MED ORDER — ROCURONIUM BROMIDE 50 MG/5ML IV SOSY
PREFILLED_SYRINGE | INTRAVENOUS | Status: AC
  Filled 2016-08-18: qty 5

## 2016-08-18 MED ORDER — FENTANYL CITRATE (PF) 100 MCG/2ML IJ SOLN
INTRAMUSCULAR | Status: AC
  Administered 2016-08-18: 50 ug via INTRAVENOUS
  Filled 2016-08-18: qty 2

## 2016-08-18 MED ORDER — ROCURONIUM BROMIDE 10 MG/ML (PF) SYRINGE
PREFILLED_SYRINGE | INTRAVENOUS | Status: DC | PRN
  Administered 2016-08-18: 40 mg via INTRAVENOUS

## 2016-08-18 MED ORDER — LIDOCAINE 2% (20 MG/ML) 5 ML SYRINGE
INTRAMUSCULAR | Status: DC | PRN
  Administered 2016-08-18: 100 mg via INTRAVENOUS

## 2016-08-18 MED ORDER — FENTANYL CITRATE (PF) 100 MCG/2ML IJ SOLN
25.0000 ug | INTRAMUSCULAR | Status: DC | PRN
  Administered 2016-08-18 (×2): 50 ug via INTRAVENOUS

## 2016-08-18 MED ORDER — PROMETHAZINE HCL 25 MG/ML IJ SOLN: 6.2500 mg | INTRAMUSCULAR | Status: DC | PRN

## 2016-08-18 SURGICAL SUPPLY — 25 items
BAG URO CATCHER STRL LF (MISCELLANEOUS) ×3 IMPLANT
BASKET LASER NITINOL 1.9FR (BASKET) IMPLANT
BASKET STONE NCOMPASS (UROLOGICAL SUPPLIES) IMPLANT
BSKT STON RTRVL 120 1.9FR (BASKET)
CATH URET 5FR 28IN OPEN ENDED (CATHETERS) IMPLANT
CATH URET DUAL LUMEN 6-10FR 50 (CATHETERS) ×3 IMPLANT
CLOTH BEACON ORANGE TIMEOUT ST (SAFETY) ×3 IMPLANT
EXTRACTOR STONE NITINOL NGAGE (UROLOGICAL SUPPLIES) ×2 IMPLANT
FIBER LASER FLEXIVA 1000 (UROLOGICAL SUPPLIES) IMPLANT
FIBER LASER FLEXIVA 365 (UROLOGICAL SUPPLIES) IMPLANT
FIBER LASER FLEXIVA 550 (UROLOGICAL SUPPLIES) IMPLANT
FIBER LASER TRAC TIP (UROLOGICAL SUPPLIES) ×2 IMPLANT
GLOVE SURG SS PI 8.0 STRL IVOR (GLOVE) IMPLANT
GOWN STRL REUS W/TWL XL LVL3 (GOWN DISPOSABLE) ×3 IMPLANT
GUIDEWIRE STR DUAL SENSOR (WIRE) ×3 IMPLANT
IV NS 1000ML (IV SOLUTION) ×3
IV NS 1000ML BAXH (IV SOLUTION) ×1 IMPLANT
IV NS IRRIG 3000ML ARTHROMATIC (IV SOLUTION) ×3 IMPLANT
MANIFOLD NEPTUNE II (INSTRUMENTS) ×3 IMPLANT
PACK CYSTO (CUSTOM PROCEDURE TRAY) ×3 IMPLANT
SHEATH ACCESS URETERAL 38CM (SHEATH) ×3 IMPLANT
SHEATH URET ACCESS 10/12FR (MISCELLANEOUS) IMPLANT
STENT URET 6FRX26 CONTOUR (STENTS) ×2 IMPLANT
TUBING CONNECTING 10 (TUBING) ×2 IMPLANT
TUBING CONNECTING 10' (TUBING) ×1

## 2016-08-18 NOTE — Anesthesia Postprocedure Evaluation (Signed)
Anesthesia Post Note  Patient: Nichole Sandoval  Procedure(s) Performed: Procedure(s) (LRB): URETEROSCOPY WITH HOLMIUM LASER LITHOTRIPSY/STENT (Left) HOLMIUM LASER APPLICATION (Left)  Patient location during evaluation: PACU Anesthesia Type: General Level of consciousness: awake and alert, oriented and patient cooperative Pain management: pain level controlled Vital Signs Assessment: post-procedure vital signs reviewed and stable Respiratory status: spontaneous breathing, nonlabored ventilation and respiratory function stable Cardiovascular status: blood pressure returned to baseline and stable Postop Assessment: no signs of nausea or vomiting Anesthetic complications: no       Last Vitals:  Vitals:   08/18/16 1112 08/18/16 1132  BP: (!) 156/72 137/66  Pulse: 63 86  Resp: 14 16  Temp: 36.8 C 36.5 C    Last Pain:  Vitals:   08/18/16 1132  TempSrc: Oral  PainSc: 2                  Bernie Fobes,E. Marinda Tyer

## 2016-08-18 NOTE — Brief Op Note (Signed)
08/18/2016  9:50 AM  PATIENT:  Nichole Sandoval  66 y.o. female  PRE-OPERATIVE DIAGNOSIS:  LEFT PROXIMAL URETERAL STONE  POST-OPERATIVE DIAGNOSIS:  LEFT PROXIMAL URETERAL STONE  PROCEDURE:  Procedure(s): CYSTOSCOPY with REMOVAL OF LEFT STENT URETEROSCOPY WITH HOLMIUM LASER LITHOTRIPSY/STENT (Left) HOLMIUM LASER APPLICATION (Left)  SURGEON:  Surgeon(s) and Role:    * Irine Seal, MD - Primary  PHYSICIAN ASSISTANT:   ASSISTANTS: none   ANESTHESIA:   general  EBL:  No intake/output data recorded.  BLOOD ADMINISTERED:none  DRAINS: 6 x 26 left JJ stent   LOCAL MEDICATIONS USED:  NONE  SPECIMEN:  Source of Specimen:  left ureteral stone.  DISPOSITION OF SPECIMEN:  to patient   COUNTS:  YES  TOURNIQUET:  * No tourniquets in log *  DICTATION: .Other Dictation: Dictation Number [id number]  PLAN OF CARE: Discharge to home after PACU  PATIENT DISPOSITION:  PACU - hemodynamically stable.   Delay start of Pharmacological VTE agent (>24hrs) due to surgical blood loss or risk of bleeding: not applicable

## 2016-08-18 NOTE — Interval H&P Note (Signed)
History and Physical Interval Note:  08/18/2016 8:54 AM  Nichole Sandoval  has presented today for surgery, with the diagnosis of LEFT PROXIMAL URETERAL STONE  The various methods of treatment have been discussed with the patient and family. After consideration of risks, benefits and other options for treatment, the patient has consented to  Procedure(s): URETEROSCOPY WITH HOLMIUM LASER LITHOTRIPSY/STENT (Left) HOLMIUM LASER APPLICATION (Left) as a surgical intervention .  The patient's history has been reviewed, patient examined, no change in status, stable for surgery.  I have reviewed the patient's chart and labs.  Questions were answered to the patient's satisfaction.     Raymie Giammarco J

## 2016-08-18 NOTE — Anesthesia Preprocedure Evaluation (Addendum)
Anesthesia Evaluation  Patient identified by MRN, date of birth, ID band Patient awake    Reviewed: Allergy & Precautions, NPO status , Patient's Chart, lab work & pertinent test results  History of Anesthesia Complications Negative for: history of anesthetic complications  Airway Mallampati: II  TM Distance: >3 FB Neck ROM: Full    Dental  (+) Dental Advisory Given, Chipped   Pulmonary sleep apnea and Continuous Positive Airway Pressure Ventilation ,    breath sounds clear to auscultation       Cardiovascular (-) anginanegative cardio ROS   Rhythm:Regular Rate:Normal     Neuro/Psych Depression negative neurological ROS     GI/Hepatic Neg liver ROS, GERD  Controlled,  Endo/Other  Morbid obesity  Renal/GU stones     Musculoskeletal  (+) Arthritis ,   Abdominal (+) + obese,   Peds  Hematology negative hematology ROS (+)   Anesthesia Other Findings Epidermolysis bullosa  Reproductive/Obstetrics                            Anesthesia Physical Anesthesia Plan  ASA: III  Anesthesia Plan: General   Post-op Pain Management:    Induction: Intravenous  Airway Management Planned: Oral ETT  Additional Equipment:   Intra-op Plan:   Post-operative Plan: Extubation in OR  Informed Consent: I have reviewed the patients History and Physical, chart, labs and discussed the procedure including the risks, benefits and alternatives for the proposed anesthesia with the patient or authorized representative who has indicated his/her understanding and acceptance.   Dental advisory given  Plan Discussed with: CRNA and Surgeon  Anesthesia Plan Comments: (Plan routine monitors, GETA)        Anesthesia Quick Evaluation

## 2016-08-18 NOTE — Discharge Instructions (Addendum)
General Anesthesia, Adult, Care After These instructions provide you with information about caring for yourself after your procedure. Your health care provider may also give you more specific instructions. Your treatment has been planned according to current medical practices, but problems sometimes occur. Call your health care provider if you have any problems or questions after your procedure. What can I expect after the procedure? After the procedure, it is common to have:  Vomiting.  A sore throat.  Mental slowness. It is common to feel:  Nauseous.  Cold or shivery.  Sleepy.  Tired.  Sore or achy, even in parts of your body where you did not have surgery. Follow these instructions at home: For at least 24 hours after the procedure:  Do not:  Participate in activities where you could fall or become injured.  Drive.  Use heavy machinery.  Drink alcohol.  Take sleeping pills or medicines that cause drowsiness.  Make important decisions or sign legal documents.  Take care of children on your own.  Rest. Eating and drinking  If you vomit, drink water, juice, or soup when you can drink without vomiting.  Drink enough fluid to keep your urine clear or pale yellow.  Make sure you have little or no nausea before eating solid foods.  Follow the diet recommended by your health care provider. General instructions  Have a responsible adult stay with you until you are awake and alert.  Return to your normal activities as told by your health care provider. Ask your health care provider what activities are safe for you.  Take over-the-counter and prescription medicines only as told by your health care provider.  If you smoke, do not smoke without supervision.  Keep all follow-up visits as told by your health care provider. This is important. Contact a health care provider if:  You continue to have nausea or vomiting at home, and medicines are not helpful.  You  cannot drink fluids or start eating again.  You cannot urinate after 8-12 hours.  You develop a skin rash.  You have fever.  You have increasing redness at the site of your procedure. Get help right away if:  You have difficulty breathing.  You have chest pain.  You have unexpected bleeding.  You feel that you are having a life-threatening or urgent problem. This information is not intended to replace advice given to you by your health care provider. Make sure you discuss any questions you have with your health care provider. Document Released: 10/19/2000 Document Revised: 12/16/2015 Document Reviewed: 06/27/2015 Elsevier Interactive Patient Education  2017 [employer] Ureteral Stent Implantation, Care After Introduction Refer to this sheet in the next few weeks. These instructions provide you with information about caring for yourself after your procedure. Your health care provider may also give you more specific instructions. Your treatment has been planned according to current medical practices, but problems sometimes occur. Call your health care provider if you have any problems or questions after your procedure. What can I expect after the procedure? After the procedure, it is common to have:  Nausea.  Mild pain when you urinate. You may feel this pain in your lower back or lower abdomen. Pain should stop within a few minutes after you urinate. This may last for up to 1 week.  A small amount of blood in your urine for several days. Follow these instructions at home:   Medicines  Take over-the-counter and prescription medicines only as told by your health care provider.  If you were prescribed an antibiotic medicine, take it as told by your health care provider. Do not stop taking the antibiotic even if you start to feel better.  Do not drive for 24 hours if you received a sedative.  Do not drive or operate heavy machinery while taking prescription pain  medicines. Activity  Return to your normal activities as told by your health care provider. Ask your health care provider what activities are safe for you.  Do not lift anything that is heavier than 10 lb (4.5 kg). Follow this limit for 1 week after your procedure, or for as long as told by your health care provider. General instructions  Watch for any blood in your urine. Call your health care provider if the amount of blood in your urine increases.  If you have a catheter:  Follow instructions from your health care provider about taking care of your catheter and collection bag.  Do not take baths, swim, or use a hot tub until your health care provider approves.  Drink enough fluid to keep your urine clear or pale yellow.  Keep all follow-up visits as told by your health care provider. This is important. Contact a health care provider if:  You have pain that gets worse or does not get better with medicine, especially pain when you urinate.  You have difficulty urinating.  You feel nauseous or you vomit repeatedly during a period of more than 2 days after the procedure. Get help right away if:  Your urine is dark red or has blood clots in it.  You are leaking urine (have incontinence).  The end of the stent comes out of your urethra.  You cannot urinate.  You have sudden, sharp, or severe pain in your abdomen or lower back.  You have a fever. This information is not intended to replace advice given to you by your health care provider. Make sure you discuss any questions you have with your health care provider.  You may remove the stent on Friday morning by pulling the attached string and if you don't feel comfortable doing that, please come to the office to have it removed.   Document Released: 03/15/2013 Document Revised: 12/19/2015 Document Reviewed: 01/25/2015  2017 Elsevier

## 2016-08-18 NOTE — Anesthesia Procedure Notes (Signed)
Procedure Name: LMA Insertion Date/Time: 08/18/2016 9:27 AM Performed by: Mychael Soots, Virgel Gess Pre-anesthesia Checklist: Patient identified, Emergency Drugs available, Suction available, Patient being monitored and Timeout performed Patient Re-evaluated:Patient Re-evaluated prior to inductionOxygen Delivery Method: Circle system utilized Preoxygenation: Pre-oxygenation with 100% oxygen Intubation Type: IV induction Ventilation: Mask ventilation without difficulty Laryngoscope Size: Mac and 4 Grade View: Grade II Tube type: Oral Tube size: 7.0 mm Number of attempts: 1 Airway Equipment and Method: Stylet Placement Confirmation: positive ETCO2,  breath sounds checked- equal and bilateral and ETT inserted through vocal cords under direct vision Secured at: 22 cm Tube secured with: Tape Dental Injury: Teeth and Oropharynx as per pre-operative assessment

## 2016-08-18 NOTE — Transfer of Care (Signed)
Immediate Anesthesia Transfer of Care Note  Patient: Nichole Sandoval  Procedure(s) Performed: Procedure(s): URETEROSCOPY WITH HOLMIUM LASER LITHOTRIPSY/STENT (Left) HOLMIUM LASER APPLICATION (Left)  Patient Location: PACU  Anesthesia Type:General  Level of Consciousness:  sedated, patient cooperative and responds to stimulation  Airway & Oxygen Therapy:Patient Spontanous Breathing and Patient connected to face mask oxgen  Post-op Assessment:  Report given to PACU RN and Post -op Vital signs reviewed and stable  Post vital signs:  Reviewed and stable  Last Vitals:  Vitals:   08/18/16 0655 08/18/16 1003  BP: (!) 150/80 138/81  Pulse: 97 72  Resp: 16 18  Temp: 36.6 C [id number]    Complications: No apparent anesthesia complications

## 2016-08-18 NOTE — H&P (View-Only) (Signed)
CC: I have ureteral stone.  HPI: Nichole Sandoval is a 66 year-old female patient who was referred by Dr. Jeffrey Caporossi, MD who is here for ureteral stone.  The problem is on the left side. She first stated noticing pain on 07/02/2016. This is not her first kidney stone. She is currently having flank pain, nausea, and vomiting. She denies having back pain, groin pain, fever, and chills. Pain is occuring on the left side. She has not caught a stone in her urine strainer since her symptoms began.   She has never had surgical treatment for calculi in the past.   Nichole Sandoval is a former patient of Dr. Nesi who had the onset last week of left flank pain. the pain was severe and associated with N/V. She had no hematuria or voiding complaints. A CT in the ER showed a 5mm left proximal stone. It was 420HU on the scan. She last had pain on Friday morning but hasn't seen anything pass. She is constipated.   Interval 08/02/16:  Nichole Sandoval presented to the ER today with intractable pain.   A KUB suggests the stone remains between L3 and L4 on the left.      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  Light Drinker.  Drinks 1 caffeinated drink per day. 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     Notes: 1 son, 1 daughter    REVIEW OF SYSTEMS:    GU Review Female:   Patient denies frequent urination, hard to postpone urination, burning /pain with urination, get up at night to urinate, leakage of urine, stream starts and stops, trouble starting your stream, have to strain to urinate, and currently pregnant.  Gastrointestinal (Upper):   Patient reports vomiting and nausea. Patient denies indigestion/ heartburn.  Gastrointestinal (Lower):   Patient reports constipation. Patient denies diarrhea.  Constitutional:   Patient denies fever, night sweats, weight loss, and fatigue.  Skin:   Patient denies skin rash/ lesion and itching.  Eyes:   Patient denies blurred vision and double vision.  Ears/ Nose/ Throat:   Patient denies sore throat and sinus problems.  Hematologic/Lymphatic:   Patient denies swollen glands and easy bruising.  Cardiovascular:   Patient denies leg swelling and chest pains.  Respiratory:   Patient reports cough. Patient denies shortness of breath.  Endocrine:   Patient denies excessive thirst.  Musculoskeletal:   Patient denies back pain and joint pain.  Neurological:   Patient denies headaches and dizziness.  Psychologic:   Patient denies depression and anxiety.   Notes: Writes romance novels.   VITAL SIGNS:      07/09/2016 11:09   AM  Weight 220 lb / 99.79 kg  Height 67 in / 170.18 cm  BP 145/82 mmHg  Pulse 98 /min  Temperature 97.9 F / 37 C  BMI 34.5 kg/m   MULTI-SYSTEM PHYSICAL EXAMINATION:    Constitutional: Well-nourished. No physical deformities. Normally developed. Good grooming.  Neck: Neck symmetrical, not swollen. Normal tracheal position.  Respiratory: No labored breathing, no use of accessory muscles. CTA  Cardiovascular: Normal temperature, RRR without  murmur.  Lymphatic: No enlargement, no tenderness of axillae supraclavicular, neck lymph nodes.  Skin: No paleness, no jaundice, no cyanosis. No lesion, no ulcer, no rash.  Neurologic / Psychiatric: Oriented to time, oriented to place, oriented to person. No depression, no anxiety, no agitation.  Gastrointestinal: Obese abdomen. No mass, no tenderness, no rigidity.   Musculoskeletal: Normal gait and station of head and neck.     PAST DATA REVIEWED:  Source Of History:  Patient  Urine Test Review:   Urinalysis  X-Ray Review: C.T. Stone Protocol: Reviewed Films. 61m left proximal stone    PROCEDURES:         KUB - 74000  A single view of the abdomen is obtained. There is a 3x561mstone over the lower edge of the left L3 transverse process. She has some degenerative lumbar disease with mild scoliosis. There are no gas or soft tissue abnormalities.       Left proximal stone has not changed since CT.    KUB on 08/02/16 shows no significant change in the stone location.      ASSESSMENT:      ICD-10 Details  1 GU:   Calculus Ureter - N20.1    PLAN:            Medications New Meds: Tamsulosin Hcl 0.4 mg capsule, ext release 24 hr 1 capsule PO Daily   #30  1 Refill(s)            Orders X-Rays: KUB          Schedule Labs: 3 Weeks - Urinalysis  X-Rays: 3 Weeks - KUB  Return Visit: 3 Weeks - Office Visit, Extender          Document Letter(s):  Created for Patient: Clinical Summary         Notes:   She has a left proximal stone that is 3x5m39mnd hasn't moved in the last week but she has been pain free.   I discussed the options including continued MET, ESWL and ureteroscopy and she wants to try to pass the stone. I will refill the tamsulosin and she will return in 3 weeks for a KUB.   08/02/16  I will proceed with cystoscopy, left RTG and ureteroscopy with stent today.

## 2016-08-19 NOTE — Op Note (Signed)
NAMEERYNN, RECHNER             ACCOUNT NO.:  [id number]  MEDICAL RECORD NO.:  [id number]  LOCATION:                                 FACILITY:  PHYSICIAN:  Marshall Cork. Jeffie Pollock, M.D.    DATE OF BIRTH:  17-Apr-1951  DATE OF PROCEDURE:  08/18/2016 DATE OF DISCHARGE:                              OPERATIVE REPORT   PROCEDURE:  Cystoscopy with removal of left double-J stent, left ureteroscopic stone extraction with holmium laser tripsy, and insertion of left double-J stent.  PREOPERATIVE DIAGNOSIS:  Left proximal ureteral stone.  POSTOPERATIVE DIAGNOSIS:  Left proximal ureteral stone.  ANESTHESIA:  General.  SPECIMEN:  Stone fragments.  DRAINS:  A 6-French x 26 cm left double-J stent.  BLOOD LOSS:  Minimal.  COMPLICATIONS:  None.  INDICATIONS:  Ms. Nichole Sandoval is a 66 year old white female who underwent placement of a left double-J stent a couple weeks ago for a left proximal stone with a tight ureter and possible pyonephrosis.  She returns now for definitive procedure.  FINDINGS OF PROCEDURE:  She was given Cipro.  She was taken to the operating room where general anesthetic was induced.  She was placed in lithotomy position.  Her perineum and genitalia were prepped with Betadine solution.  She was draped in usual sterile fashion.  Cystoscopy was performed using the 23-French scope and 30-degree lens. Examination revealed normal urethra.  There was a stent at the left ureteral orifice with edema around the orifice.  The bladder wall was otherwise unremarkable, as was the right ureteral orifice.  The stent was grasped with grasping forceps and pulled the urethral meatus.  Once the stent was at the meatus, a guidewire was passed to the kidney, and the stent was removed.  A 38 cm digital access sheath was placed just below the level of the stone, and the inner core and wire were removed.  The dual-lumen digital flexible ureteroscope was then passed through the sheath.  The  stone was identified.  It was actually pushed back into the kidney.  Initial attempts at passing the stone intact were unsuccessful due to the size.  The stone was then engaged with a 200 micron laser fiber on 0.5 W and 20 Hz.  The stone was readily fragmented.  The two largest fragments were removed.  The remaining fragments were too small to retrieve.  Once inspection revealed no significant residual fragments, the ureteroscope was removed and a guidewire was passed to the kidney.  The access catheter was removed.  The cystoscope was reinserted over the wire, and a 6-French 26 cm double-J stent with tether was passed without difficulty in the kidney under fluoroscopic wire guidance.  The wire was removed leaving good coil in the kidney and a good coil in the bladder.  The cystoscope was removed leaving the stent string exiting urethra. The string was tied and trimmed then tucked vaginally.  The patient was taken down from lithotomy position.  Her anesthetic was reversed.  She was moved to recovery room in stable condition.  There were no complications.     Marshall Cork. Jeffie Pollock, M.D.   ______________________________ Marshall Cork. Jeffie Pollock, M.D.    JJW/MEDQ  D:  08/18/2016  T:  08/19/2016  Job:  [id number]

## 2016-08-19 NOTE — Op Note (Deleted)
  The note originally documented on this encounter has been moved the the encounter in which it belongs.  

## 2017-02-12 IMAGING — CT CT RENAL STONE PROTOCOL
2 of 3 series · 16 of 46 positions shown, 18 images · non-contrast
Comparison: CT abdomen and pelvis October 13, 2010

CLINICAL DATA: LEFT flank pain beginning at midnight, nausea and
vomiting. History of kidney stones, appendectomy and hysterectomy.

EXAM:
CT ABDOMEN AND PELVIS WITHOUT CONTRAST
TECHNIQUE: Multidetector CT imaging of the abdomen and pelvis was performed
following the standard protocol without IV contrast.

[Series 3: lung · axial · 0.77mm/px · z∈[-441,-353]mm · 13 of 52 slices shown, 15 images]
[im 4/52  soft-tissue]
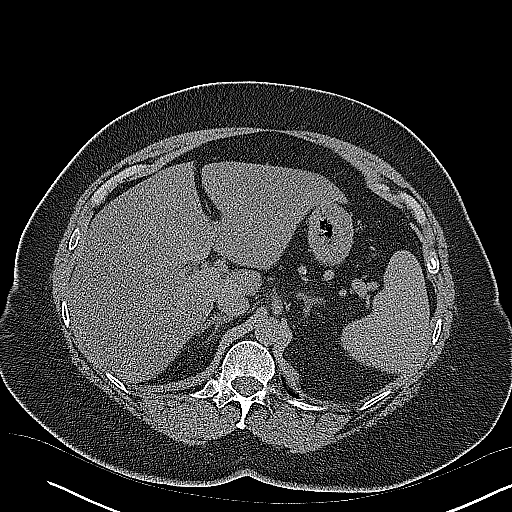
[im 4/52  bone]
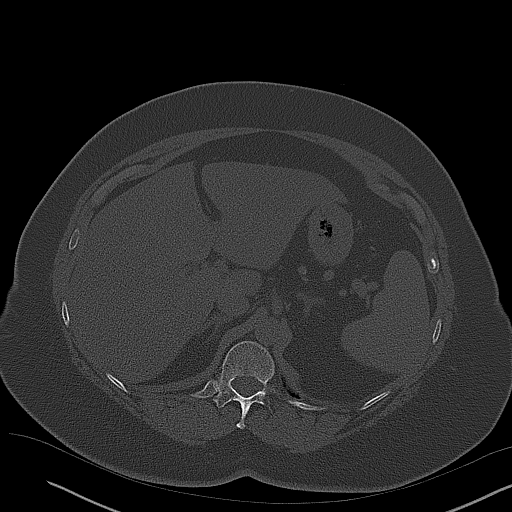
[im 7/52  soft-tissue]
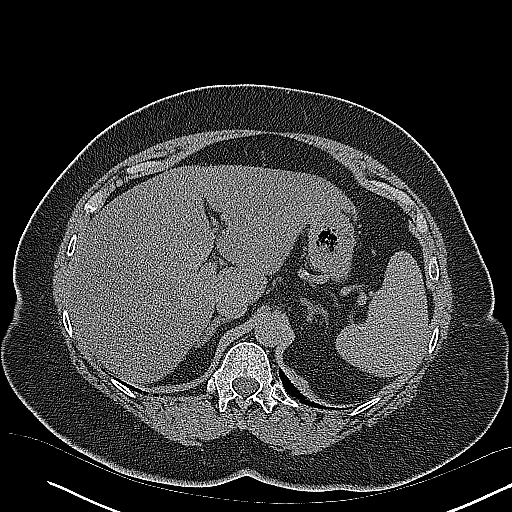
[im 10/52  soft-tissue]
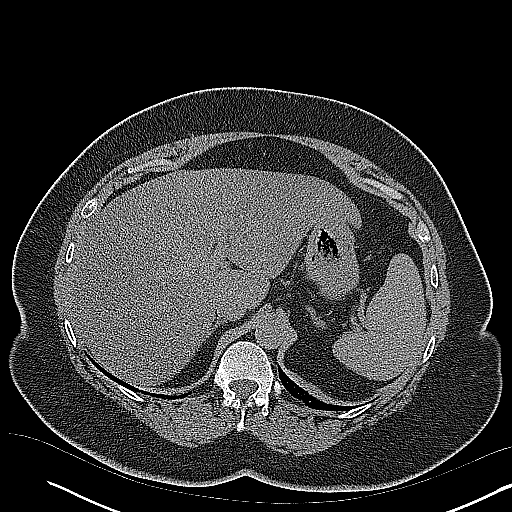
[im 15/52  soft-tissue]
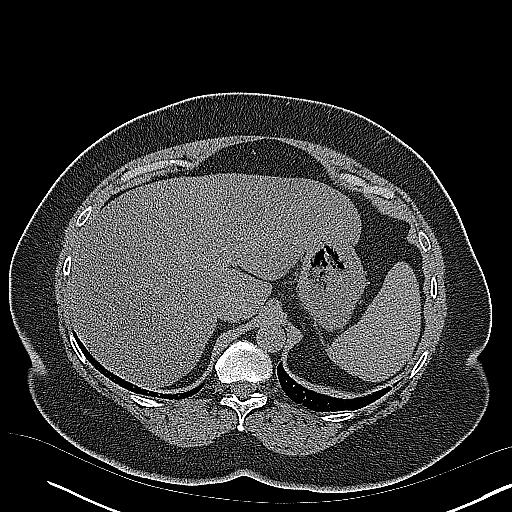
[im 19/52  soft-tissue]
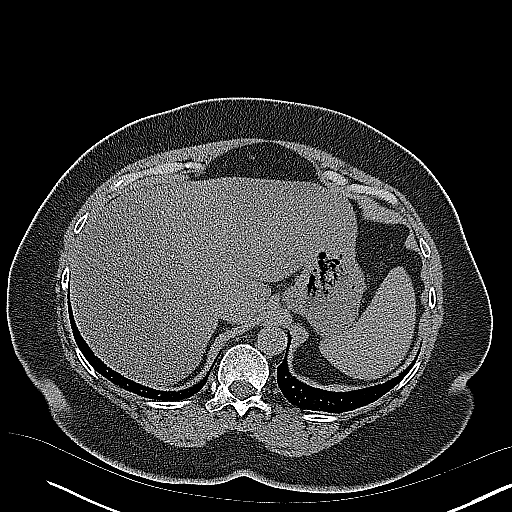
[im 22/52  soft-tissue]
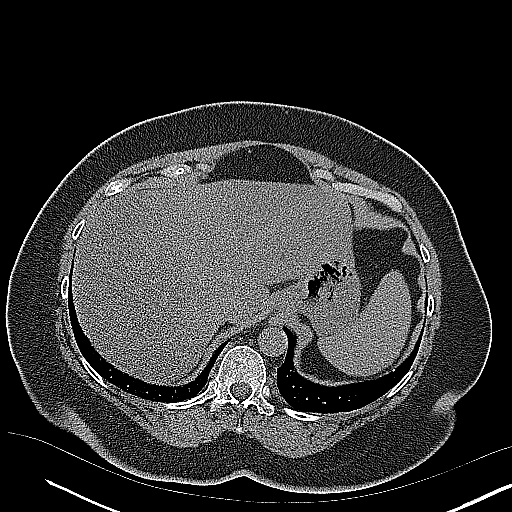
[im 27/52  soft-tissue]
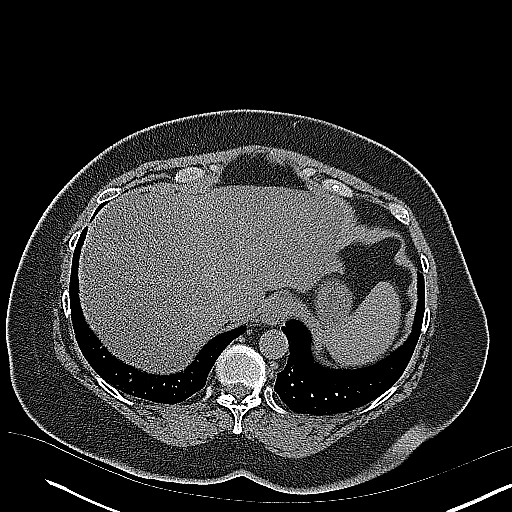
[im 30/52  soft-tissue]
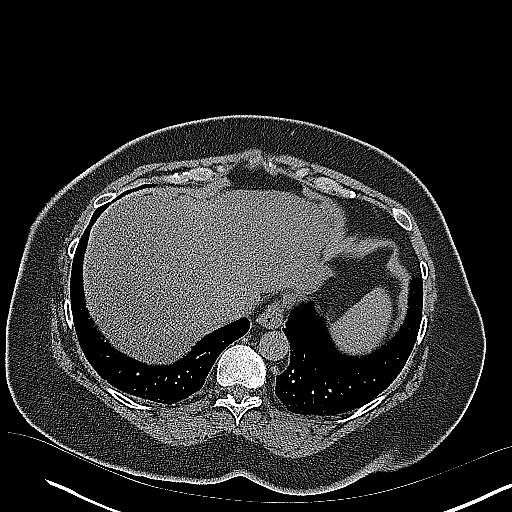
[im 33/52  soft-tissue]
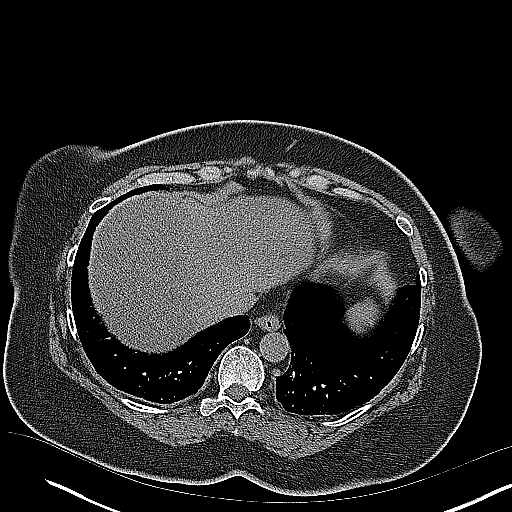
[im 33/52  bone]
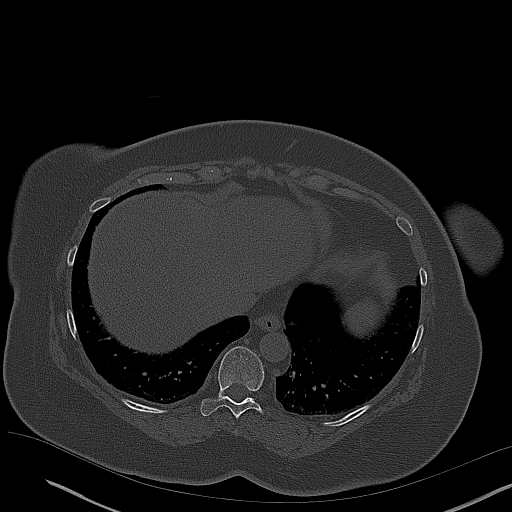
[im 37/52  soft-tissue]
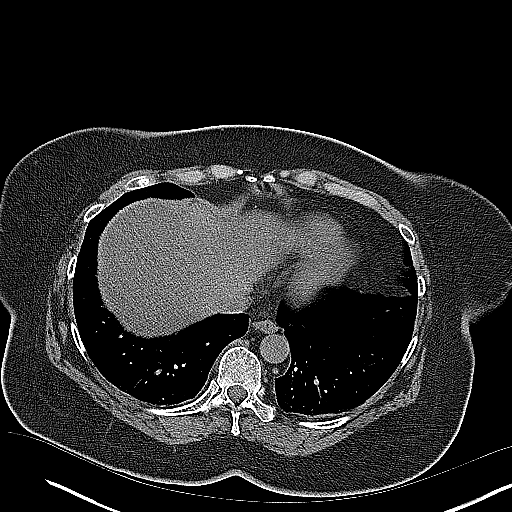
[im 42/52  soft-tissue]
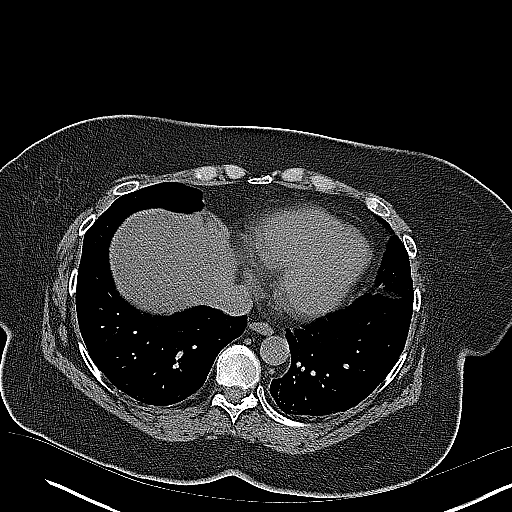
[im 45/52  soft-tissue]
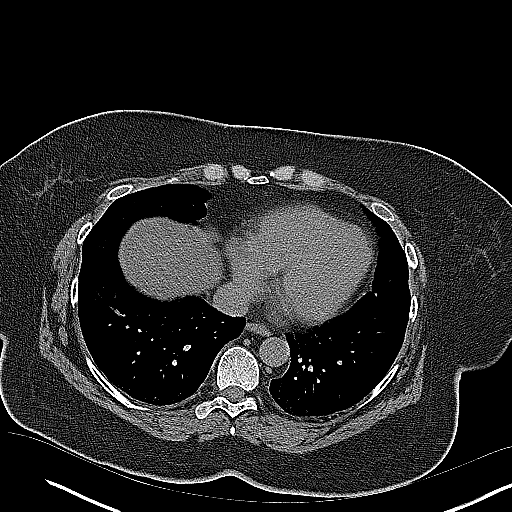
[im 48/52  soft-tissue]
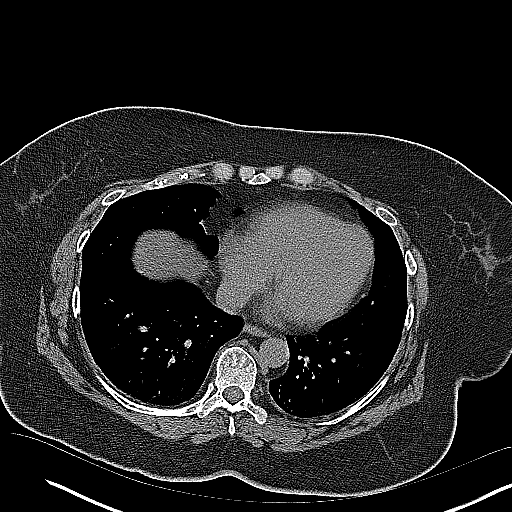

[Series 4: coronal · coronal · 0.74mm/px · 3 of 154 slices shown]
[im 52/154  soft-tissue]
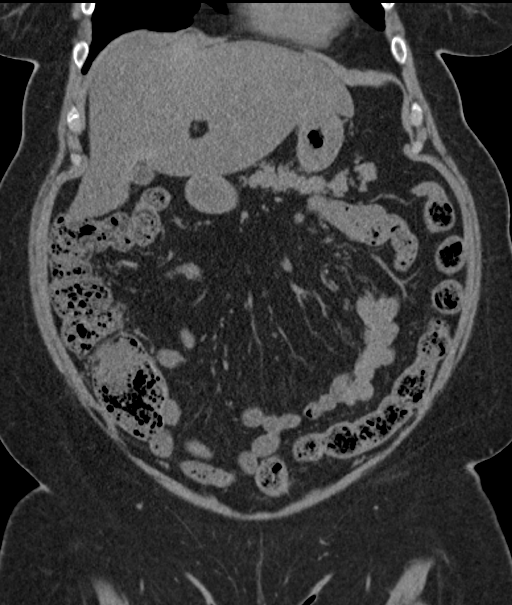
[im 69/154  soft-tissue]
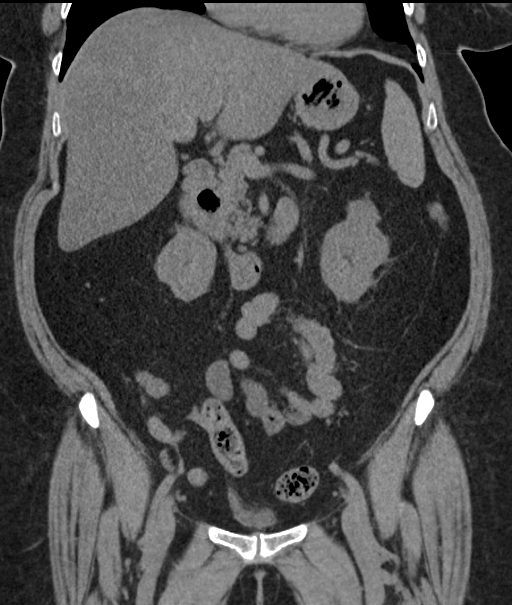
[im 86/154  soft-tissue]
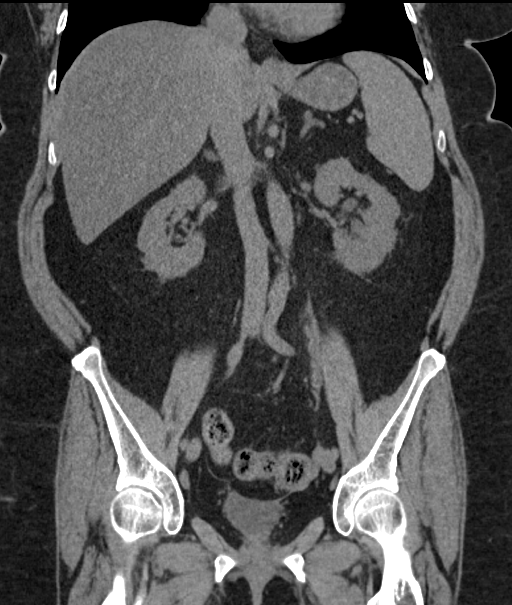

[16 of 46 positions shown; findings below may reference images not displayed]

FINDINGS: LOWER CHEST: Dependent atelectasis. Subcentimeter RIGHT lower lobe
air cyst. The visualized heart size is normal. No pericardial
effusion.

HEPATOBILIARY: Low-density liver compatible with steatosis with mild
focal fatty sparing about the gallbladder fossa.

PANCREAS: Normal.

SPLEEN: Normal.

ADRENALS/URINARY TRACT: Kidneys are orthotopic, demonstrating normal
size and morphology. Mild LEFT hydroureteronephrosis the proximal
ureter where a 5 mm calculus is present. No residual
nephrolithiasis. No RIGHT obstructive uropathy. Limited assessment
for renal masses on this nonenhanced examination. The unopacified
ureters are normal in course and caliber. Urinary bladder is
decompressed and unremarkable. Normal adrenal glands.

STOMACH/BOWEL: The stomach, small and large bowel are normal in
course and caliber without inflammatory changes, sensitivity
decreased by lack of enteric contrast. 3.2 cm duodenum cyst. Mild
amount of retained large bowel stool.

VASCULAR/LYMPHATIC: Aortoiliac vessels are normal in course and
caliber. No lymphadenopathy by CT size criteria.

REPRODUCTIVE: Status post hysterectomy.

OTHER: No intraperitoneal free fluid or free air.

MUSCULOSKELETAL: Non-acute. Mild levoscoliosis. Small fat containing
umbilical hernia in mild rectus abdominis diastases. Moderate to
severe L3-4, moderate L4-5 degenerative discs.
IMPRESSION: 5 mm proximal LEFT ureteral calculus results in mild obstructive
uropathy.

Hepatic steatosis.

## 2017-02-19 ENCOUNTER — Other Ambulatory Visit: Payer: Self-pay | Admitting: Obstetrics and Gynecology

## 2017-02-19 DIAGNOSIS — E2839 Other primary ovarian failure: Secondary | ICD-10-CM

## 2017-03-04 ENCOUNTER — Encounter: Payer: Medicare Other | Attending: Family Medicine

## 2017-03-04 DIAGNOSIS — E119 Type 2 diabetes mellitus without complications: Secondary | ICD-10-CM | POA: Diagnosis present

## 2017-03-04 DIAGNOSIS — Z713 Dietary counseling and surveillance: Secondary | ICD-10-CM | POA: Diagnosis not present

## 2017-03-04 NOTE — Progress Notes (Signed)
Patient was seen on 03/04/17 for the first of a series of three diabetes self-management courses at the [facility].  Patient Education Plan per assessed needs and concerns is to attend four course education program for Diabetes Self Management Education.  The following learning objectives were met by the patient during this class:  Describe diabetes  State some common risk factors for diabetes  Defines the role of glucose and insulin  Identifies type of diabetes and pathophysiology  Describe the relationship between diabetes and cardiovascular risk  State the members of the Healthcare Team  States the rationale for glucose monitoring  State when to test glucose  State their individual Target Range  State the importance of logging glucose readings  Describe how to interpret glucose readings  Identifies A1C target  Explain the correlation between A1c and eAG values  State symptoms and treatment of high blood glucose  State symptoms and treatment of low blood glucose  Explain proper technique for glucose testing  Identifies proper sharps disposal  Handouts given during class include:  Living Well with Diabetes book  Carb Counting and Meal Planning book  Meal Plan Card  Carbohydrate guide  Meal planning worksheet  Low Sodium Flavoring Tips  The diabetes portion plate  T2I to eAG Conversion Chart  Diabetes Medications  Diabetes Recommended Care Schedule  Support Group  Diabetes Success Plan  Core Class Satisfaction Survey   . The patient's Newest Vital Sign Health Literacy Assessment score was 5. . The patient scored 72% on the Diabetes Knowledge pre-test.  Packet number: 48 . Educational strategies utilized during class included repetition, teach-back, eliminating medical jargon, and being open to questions.   Follow-Up Plan:  Attend core 2

## 2017-03-08 ENCOUNTER — Ambulatory Visit
Admission: RE | Admit: 2017-03-08 | Discharge: 2017-03-08 | Disposition: A | Discharge status: Home / Self Care | Payer: Medicare Other | Source: Ambulatory Visit | Attending: Obstetrics and Gynecology | Admitting: Obstetrics and Gynecology

## 2017-03-08 DIAGNOSIS — E2839 Other primary ovarian failure: Secondary | ICD-10-CM

## 2017-03-11 DIAGNOSIS — E119 Type 2 diabetes mellitus without complications: Secondary | ICD-10-CM

## 2017-03-11 DIAGNOSIS — Z713 Dietary counseling and surveillance: Secondary | ICD-10-CM | POA: Diagnosis not present

## 2017-03-11 NOTE — Progress Notes (Signed)
Patient was seen on 03/11/17 for the second of a series of three diabetes self-management courses at the [facility]. The following learning objectives were met by the patient during this class:   Describe the role of different macronutrients on glucose  Explain how carbohydrates affect blood glucose  State what foods contain the most carbohydrates  Demonstrate carbohydrate counting  Demonstrate how to read Nutrition Facts food label  Describe effects of various fats on heart health  Describe the importance of good nutrition for health and healthy eating strategies  Describe techniques for managing your shopping, cooking and meal planning  List strategies to follow meal plan when dining out  Describe the effects of alcohol on glucose and how to use it safely  Goals:  Follow Diabetes Meal Plan as instructed  Aim to spread carbs evenly throughout the day  Aim for 3 meals per day and snacks as needed Include lean protein foods to meals/snacks  Monitor glucose levels as instructed by your doctor   Follow-Up Plan:  Attend Core 3  Work towards following your personal food plan.

## 2017-03-18 ENCOUNTER — Encounter: Payer: Medicare Other | Admitting: Dietician

## 2017-03-18 DIAGNOSIS — Z713 Dietary counseling and surveillance: Secondary | ICD-10-CM | POA: Diagnosis not present

## 2017-03-18 DIAGNOSIS — E119 Type 2 diabetes mellitus without complications: Secondary | ICD-10-CM

## 2017-03-18 NOTE — Progress Notes (Signed)
Patient was seen on 03/18/17 for the third of a series of three diabetes self-management courses at the [facility].   Nichole Sandoval the amount of activity recommended for healthy living . Describe activities suitable for individual needs . Identify ways to regularly incorporate activity into daily life . Identify barriers to activity and ways to over come these barriers  Identify diabetes medications being personally used and their primary action for lowering glucose and possible side effects . Describe role of stress on blood glucose and develop strategies to address psychosocial issues . Identify diabetes complications and ways to prevent them  Explain how to manage diabetes during illness . Evaluate success in meeting personal goal . Establish 2-3 goals that they will plan to diligently work on until they return for the  25-month follow-up visit  Goals:   I will count my carb choices at most meals and snacks  I will be active 70 minutes or more 4 times a week  I will continue to use Weight Watchers  I will test my glucose at least 2 times a day, 7 days a week  I will make lists and schedules to stay focused  To help manage stress I will continue regular exercise at least 4 times a week and continue to manage depression  Your patient has identified these potential barriers to change:  Motivation  Your patient has identified their diabetes self-care support plan as  Family Support   . The patient scored 89% on the Diabetes Knowledge post-test.   Plan:  Attend Support Group as desired

## 2017-06-28 ENCOUNTER — Ambulatory Visit (AMBULATORY_SURGERY_CENTER): Payer: Self-pay | Admitting: *Deleted

## 2017-06-28 ENCOUNTER — Other Ambulatory Visit: Payer: Self-pay

## 2017-06-28 VITALS — Ht 67.0 in | Wt 200.0 lb

## 2017-06-28 DIAGNOSIS — Z86010 Personal history of colonic polyps: Secondary | ICD-10-CM

## 2017-06-28 MED ORDER — NA SULFATE-K SULFATE-MG SULF 17.5-3.13-1.6 GM/177ML PO SOLN: ORAL | 0 refills | Status: DC

## 2017-06-28 NOTE — Progress Notes (Signed)
Patient denies any allergies to eggs or soy. Patient denies any problems with anesthesia/sedation. Patient denies any oxygen use at home. Patient denies taking any diet/weight loss medications or blood thinners. EMMI education assisgned to patient on colonoscopy, this was explained and instructions given to patient. 

## 2017-06-30 ENCOUNTER — Encounter: Payer: Self-pay | Admitting: Gastroenterology

## 2017-07-05 ENCOUNTER — Encounter: Payer: Medicare Other | Admitting: Gastroenterology

## 2017-08-02 ENCOUNTER — Ambulatory Visit (AMBULATORY_SURGERY_CENTER): Payer: Medicare Other | Admitting: Gastroenterology

## 2017-08-02 ENCOUNTER — Encounter: Payer: Self-pay | Admitting: Gastroenterology

## 2017-08-02 ENCOUNTER — Other Ambulatory Visit: Payer: Self-pay

## 2017-08-02 VITALS — BP 122/54 | HR 56 | Temp 96.2°F | Resp 10 | Ht 67.0 in | Wt 200.0 lb

## 2017-08-02 DIAGNOSIS — D123 Benign neoplasm of transverse colon: Secondary | ICD-10-CM

## 2017-08-02 DIAGNOSIS — D120 Benign neoplasm of cecum: Secondary | ICD-10-CM | POA: Diagnosis not present

## 2017-08-02 DIAGNOSIS — D125 Benign neoplasm of sigmoid colon: Secondary | ICD-10-CM

## 2017-08-02 DIAGNOSIS — Z86010 Personal history of colonic polyps: Secondary | ICD-10-CM | POA: Diagnosis not present

## 2017-08-02 DIAGNOSIS — D124 Benign neoplasm of descending colon: Secondary | ICD-10-CM

## 2017-08-02 MED ORDER — SODIUM CHLORIDE 0.9 % IV SOLN: 500.0000 mL | Freq: Once | INTRAVENOUS | Status: DC

## 2017-08-02 NOTE — Op Note (Signed)
[facility] Patient Name: Nichole Sandoval Procedure Date: 08/02/2017 1:47 PM MRN: [id number] Endoscopist: Ladene Artist , MD Age: 67 Referring MD:  Date of Birth: 1951/05/08 Gender: Female Account #: [id number] Procedure:                Colonoscopy Indications:              Surveillance: Personal history of adenomatous                            polyps on last colonoscopy 5 years ago Medicines:                Monitored Anesthesia Care Procedure:                Pre-Anesthesia Assessment:                           - Prior to the procedure, a History and Physical                            was performed, and patient medications and                            allergies were reviewed. The patient's tolerance of                            previous anesthesia was also reviewed. The risks                            and benefits of the procedure and the sedation                            options and risks were discussed with the patient.                            All questions were answered, and informed consent                            was obtained. Prior Anticoagulants: The patient has                            taken no previous anticoagulant or antiplatelet                            agents. ASA Grade Assessment: II - A patient with                            mild systemic disease. After reviewing the risks                            and benefits, the patient was deemed in                            satisfactory condition to undergo the procedure.  After obtaining informed consent, the colonoscope                            was passed under direct vision. Throughout the                            procedure, the patient's blood pressure, pulse, and                            oxygen saturations were monitored continuously. The                            Colonoscope was introduced through the anus and                            advanced to the the cecum,  identified by                            appendiceal orifice and ileocecal valve. The                            ileocecal valve, appendiceal orifice, and rectum                            were photographed. The quality of the bowel                            preparation was good. The colonoscopy was performed                            without difficulty. The patient tolerated the                            procedure well. Scope In: 1:55:17 PM Scope Out: 2:18:43 PM Scope Withdrawal Time: 0 hours 19 minutes 14 seconds  Total Procedure Duration: 0 hours 23 minutes 26 seconds  Findings:                 The perianal and digital rectal examinations were                            normal.                           A 10 mm polyp was found in the sigmoid colon. The                            polyp was semi-pedunculated. The polyp was removed                            with a hot snare. Resection and retrieval were                            complete.  Two sessile polyps were found in the transverse                            colon and cecum. The polyps were 6 to 7 mm in size.                            These polyps were removed with a cold snare.                            Resection and retrieval were complete.                           Four sessile polyps were found in the descending                            colon (2) and transverse colon (2). The polyps were                            4 to 5 mm in size. These polyps were removed with a                            cold biopsy forceps. Resection and retrieval were                            complete.                           A few small-mouthed diverticula were found in the                            left colon. There was no evidence of diverticular                            bleeding.                           The exam was otherwise without abnormality on                            direct and retroflexion  views. Complications:            No immediate complications. Estimated blood loss:                            None. Estimated Blood Loss:     Estimated blood loss: none. Impression:               - One 10 mm polyp in the sigmoid colon, removed                            with a hot snare. Resected and retrieved.                           - Two 6 to 7 mm polyps in the transverse colon  and                            in the cecum, removed with a cold snare. Resected                            and retrieved.                           - Four 4 to 5 mm polyps in the descending colon and                            in the transverse colon, removed with a cold biopsy                            forceps. Resected and retrieved.                           - Mild diverticulosis in the left colon. There was                            no evidence of diverticular bleeding.                           - The examination was otherwise normal on direct                            and retroflexion views. Recommendation:           - Repeat colonoscopy in 3 - 5 years for                            surveillance pending pathology review.                           - Patient has a contact number available for                            emergencies. The signs and symptoms of potential                            delayed complications were discussed with the                            patient. Return to normal activities tomorrow.                            Written discharge instructions were provided to the                            patient.                           - Resume previous diet.                           -  Continue present medications.                           - Await pathology results.                           - No aspirin, ibuprofen, naproxen, or other                            non-steroidal anti-inflammatory drugs for 2 weeks                            after polyp removal. Ladene Artist,  MD 08/02/2017 2:24:14 PM This report has been signed electronically.

## 2017-08-02 NOTE — Progress Notes (Signed)
Called to room to assist during endoscopic procedure.  Patient ID and intended procedure confirmed with present staff. Received instructions for my participation in the procedure from the performing physician.  

## 2017-08-02 NOTE — Progress Notes (Signed)
Report to PACU, RN, vss, BBS= Clear.  

## 2017-08-02 NOTE — Patient Instructions (Addendum)
YOU HAD AN ENDOSCOPIC PROCEDURE TODAY AT [facility]:   Refer to the procedure report that was given to you for any specific questions about what was found during the examination.  If the procedure report does not answer your questions, please call your gastroenterologist to clarify.  If you requested that your care partner not be given the details of your procedure findings, then the procedure report has been included in a sealed envelope for you to review at your convenience later.  YOU SHOULD EXPECT: Some feelings of bloating in the abdomen. Passage of more gas than usual.  Walking can help get rid of the air that was put into your GI tract during the procedure and reduce the bloating. If you had a lower endoscopy (such as a colonoscopy or flexible sigmoidoscopy) you may notice spotting of blood in your stool or on the toilet paper. If you underwent a bowel prep for your procedure, you may not have a normal bowel movement for a few days.  Please Note:  You might notice some irritation and congestion in your nose or some drainage.  This is from the oxygen used during your procedure.  There is no need for concern and it should clear up in a day or so.  SYMPTOMS TO REPORT IMMEDIATELY:   Following lower endoscopy (colonoscopy or flexible sigmoidoscopy):  Excessive amounts of blood in the stool  Significant tenderness or worsening of abdominal pains  Swelling of the abdomen that is new, acute  Fever of 100F or higher   For urgent or emergent issues, a gastroenterologist can be reached at any hour by calling [phone].   DIET:  We do recommend a small meal at first, but then you may proceed to your regular diet.  Drink plenty of fluids but you should avoid alcoholic beverages for 24 hours. Try to increase the fiber in your diet, and drink plenty of water.  ACTIVITY:  You should plan to take it easy for the rest of today and you should NOT DRIVE or use heavy machinery until  tomorrow (because of the sedation medicines used during the test).    FOLLOW UP: Our staff will call the number listed on your records the next business day following your procedure to check on you and address any questions or concerns that you may have regarding the information given to you following your procedure. If we do not reach you, we will leave a message.  However, if you are feeling well and you are not experiencing any problems, there is no need to return our call.  We will assume that you have returned to your regular daily activities without incident.  If any biopsies were taken you will be contacted by phone or by letter within the next 1-3 weeks.  Please call us at [phone] if you have not heard about the biopsies in 3 weeks.    SIGNATURES/CONFIDENTIALITY: You and/or your care partner have signed paperwork which will be entered into your electronic medical record.  These signatures attest to the fact that that the information above on your After Visit Summary has been reviewed and is understood.  Full responsibility of the confidentiality of this discharge information lies with you and/or your care-partner.  Colon recall 3-5 years.    No NSAIDS ie: aspirin, aleve or ibuprofen for two weeks per Dr. Fuller Plan.

## 2017-08-03 ENCOUNTER — Telehealth: Payer: Self-pay

## 2017-08-03 NOTE — Telephone Encounter (Signed)
  Follow up Call-  Call back number 08/02/2017  Post procedure Call Back phone  # [phone]  Permission to leave phone message Yes  Some recent data might be hidden     Patient questions:  Do you have a fever, pain , or abdominal swelling? No. Pain Score  0 *  Have you tolerated food without any problems? Yes.    Have you been able to return to your normal activities? Yes.    Do you have any questions about your discharge instructions: Diet   No. Medications  No. Follow up visit  No.  Do you have questions or concerns about your Care? No.  Actions: * If pain score is 4 or above: No action needed, pain <4.

## 2017-08-12 ENCOUNTER — Encounter: Payer: Self-pay | Admitting: Gastroenterology

## 2018-03-22 ENCOUNTER — Other Ambulatory Visit: Payer: Self-pay | Admitting: Family Medicine

## 2018-03-22 DIAGNOSIS — R0989 Other specified symptoms and signs involving the circulatory and respiratory systems: Secondary | ICD-10-CM

## 2018-03-30 ENCOUNTER — Ambulatory Visit
Admission: RE | Admit: 2018-03-30 | Discharge: 2018-03-30 | Disposition: A | Discharge status: Home / Self Care | Payer: Medicare Other | Source: Ambulatory Visit | Attending: Family Medicine | Admitting: Family Medicine

## 2018-03-30 DIAGNOSIS — R0989 Other specified symptoms and signs involving the circulatory and respiratory systems: Secondary | ICD-10-CM

## 2019-05-25 IMAGING — US US CAROTID DUPLEX BILAT
1 series · 13 of 24 positions shown · non-contrast
Comparison: None.

CLINICAL DATA: Week right carotid pulse

EXAM:
BILATERAL CAROTID DUPLEX ULTRASOUND
TECHNIQUE: Gray scale imaging, color Doppler and duplex ultrasound were
performed of bilateral carotid and vertebral arteries in the neck.

[Series 1: us carotid duplex bilat · 0.06mm/px · 13 of 90 slices shown]
[im 1/90]
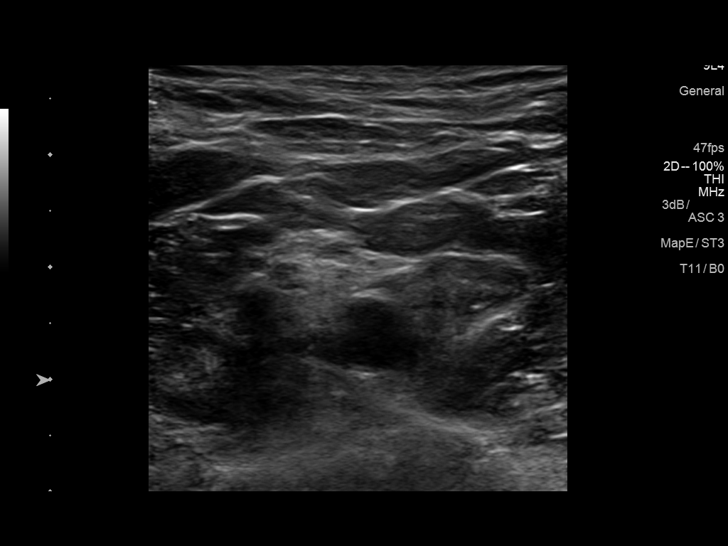
[im 8/90]
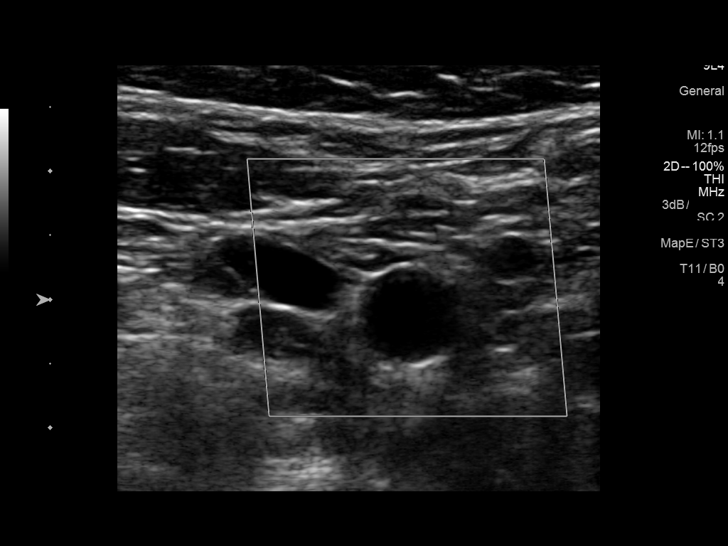
[im 16/90]
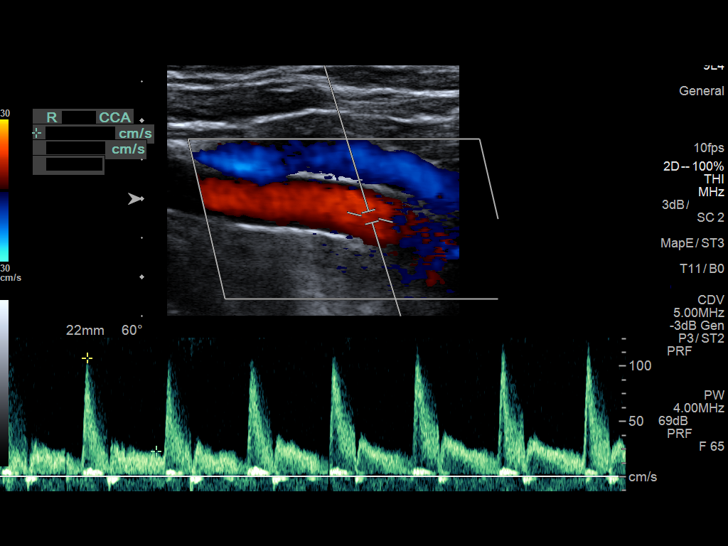
[im 24/90]
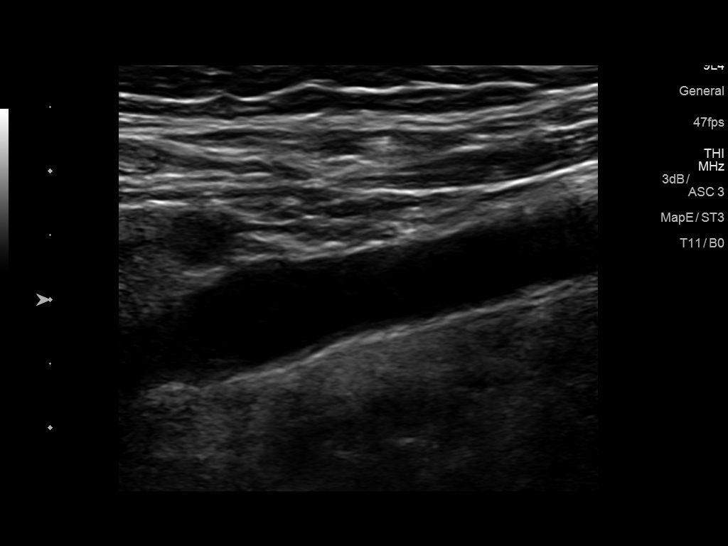
[im 31/90]
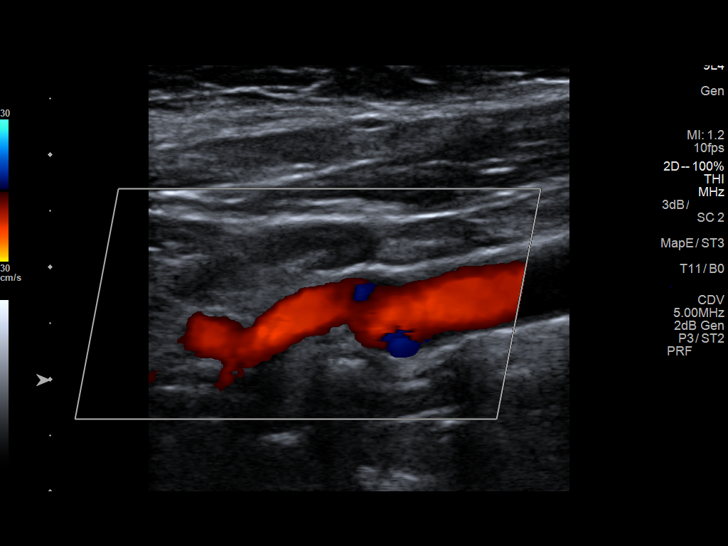
[im 39/90]
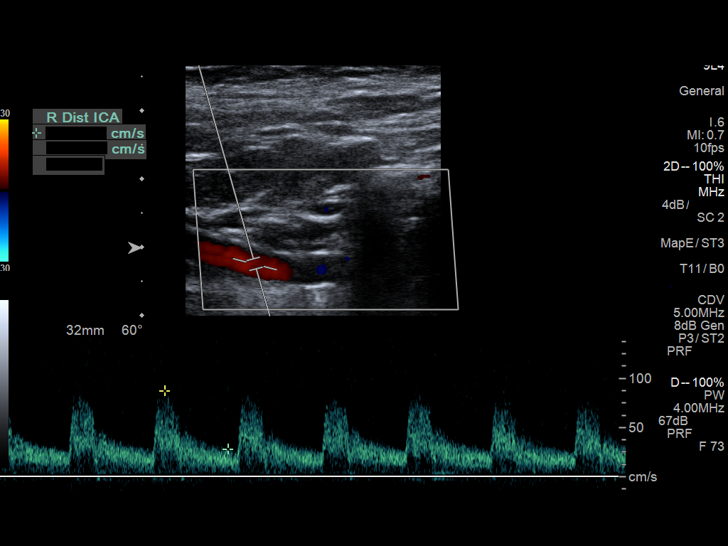
[im 47/90]
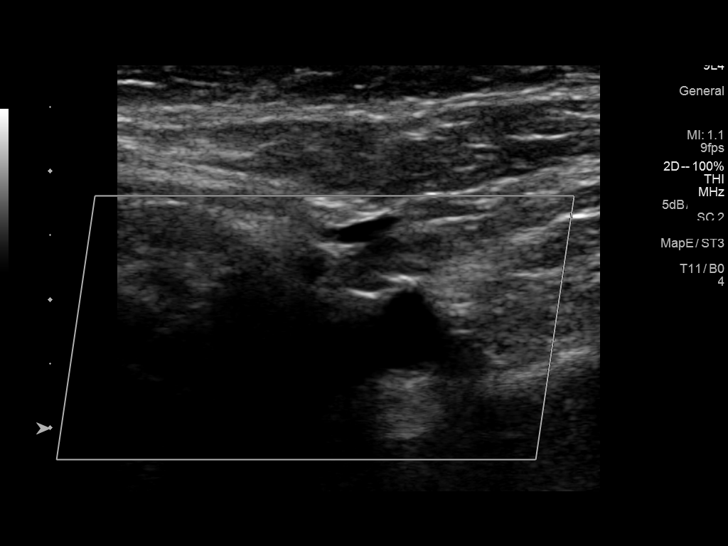
[im 51/90]
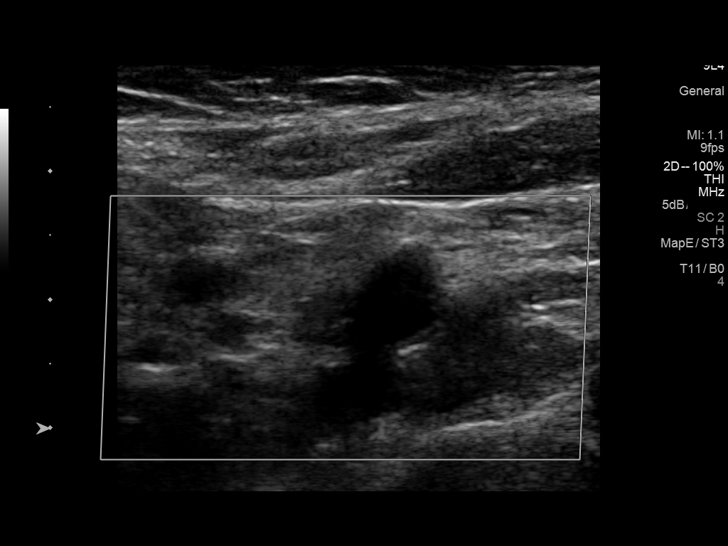
[im 59/90]
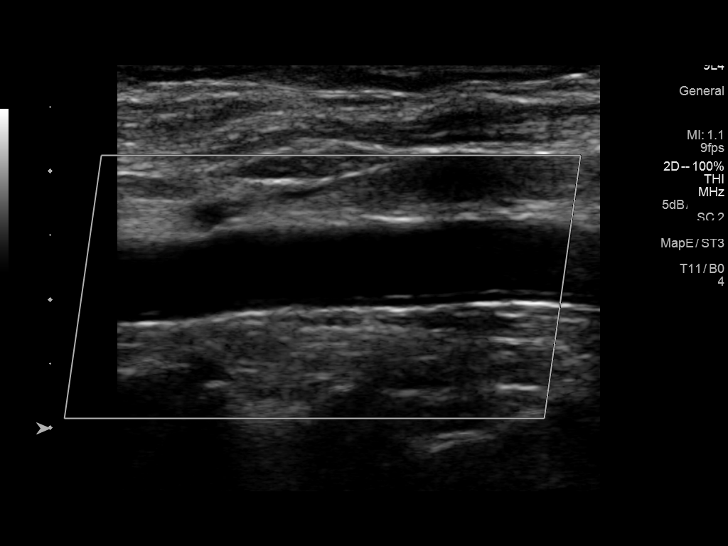
[im 66/90]
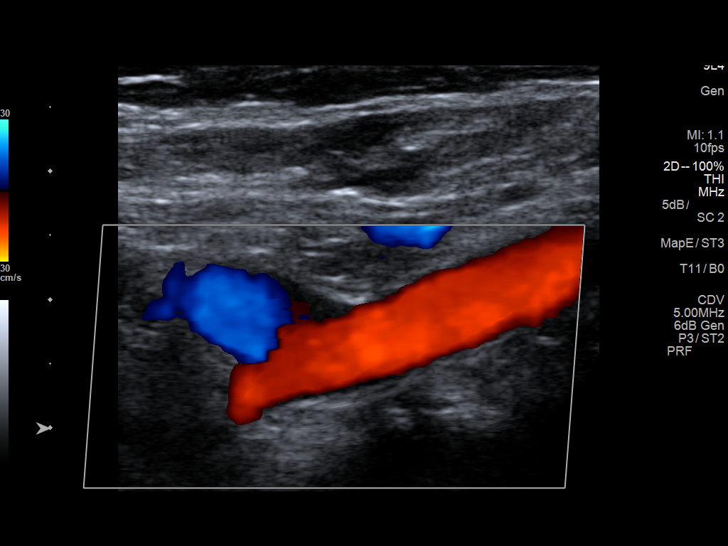
[im 74/90]
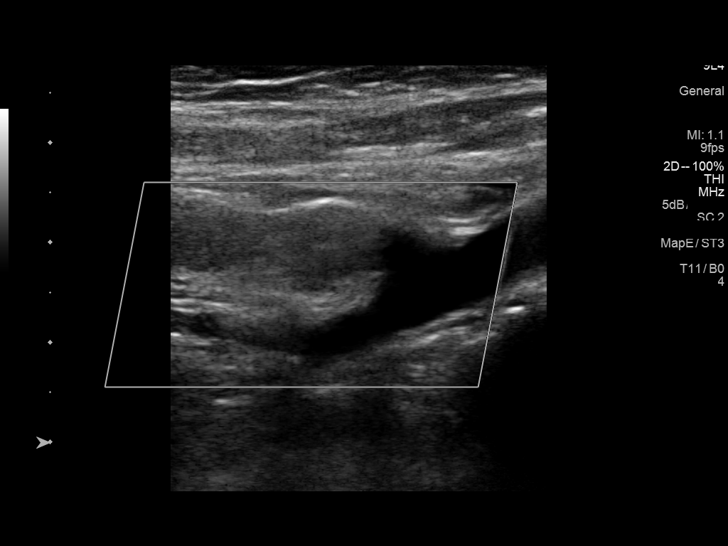
[im 82/90]
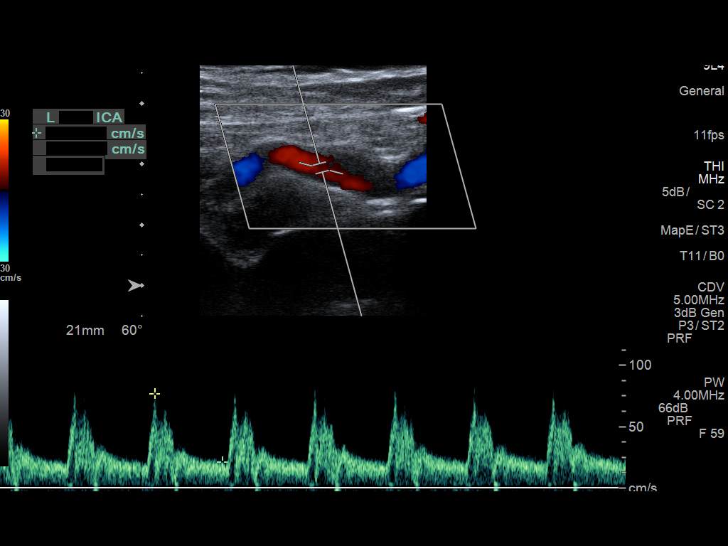
[im 90/90]
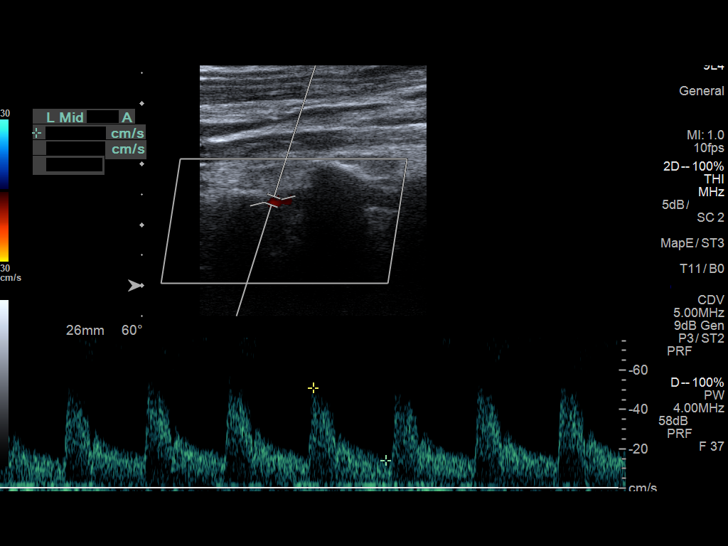

[13 of 24 positions shown; findings below may reference images not displayed]

FINDINGS: Criteria: Quantification of carotid stenosis is based on velocity
parameters that correlate the residual internal carotid diameter
with NASCET-based stenosis levels, using the diameter of the distal
internal carotid lumen as the denominator for stenosis measurement.

The following velocity measurements were obtained:

RIGHT

ICA: 115 cm/sec

CCA: 107 cm/sec

SYSTOLIC ICA/CCA RATIO:

ECA: 114 cm/sec

LEFT

ICA: 116 cm/sec

CCA: 123 cm/sec

SYSTOLIC ICA/CCA RATIO:

ECA: 91 cm/sec

RIGHT CAROTID ARTERY: Minimal smooth soft plaque in the bulb. Low
resistance internal carotid Doppler pattern.

RIGHT VERTEBRAL ARTERY:  Antegrade.

LEFT CAROTID ARTERY: Minimal smooth soft plaque in the bulb. Low
resistance internal carotid Doppler pattern.

LEFT VERTEBRAL ARTERY:  Antegrade.

Upper extremity blood pressures: RIGHT: 149/81 LEFT: 142/76
IMPRESSION: Less than 50% stenosis in the right and left internal carotid
arteries.

## 2019-06-30 ENCOUNTER — Other Ambulatory Visit: Payer: Self-pay

## 2019-06-30 DIAGNOSIS — Z20822 Contact with and (suspected) exposure to covid-19: Secondary | ICD-10-CM

## 2019-07-02 LAB — NOVEL CORONAVIRUS, NAA: SARS-CoV-2, NAA: NOT DETECTED

## 2020-04-29 DIAGNOSIS — E1169 Type 2 diabetes mellitus with other specified complication: Secondary | ICD-10-CM | POA: Diagnosis not present

## 2020-04-29 DIAGNOSIS — R1013 Epigastric pain: Secondary | ICD-10-CM | POA: Diagnosis not present

## 2020-04-29 DIAGNOSIS — K219 Gastro-esophageal reflux disease without esophagitis: Secondary | ICD-10-CM | POA: Diagnosis not present

## 2020-04-29 DIAGNOSIS — E559 Vitamin D deficiency, unspecified: Secondary | ICD-10-CM | POA: Diagnosis not present

## 2020-04-29 DIAGNOSIS — Q810 Epidermolysis bullosa simplex: Secondary | ICD-10-CM | POA: Diagnosis not present

## 2020-04-29 DIAGNOSIS — E7800 Pure hypercholesterolemia, unspecified: Secondary | ICD-10-CM | POA: Diagnosis not present

## 2020-05-02 DIAGNOSIS — E559 Vitamin D deficiency, unspecified: Secondary | ICD-10-CM | POA: Diagnosis not present

## 2020-05-02 DIAGNOSIS — Z0000 Encounter for general adult medical examination without abnormal findings: Secondary | ICD-10-CM | POA: Diagnosis not present

## 2020-05-02 DIAGNOSIS — F322 Major depressive disorder, single episode, severe without psychotic features: Secondary | ICD-10-CM | POA: Diagnosis not present

## 2020-05-02 DIAGNOSIS — E7800 Pure hypercholesterolemia, unspecified: Secondary | ICD-10-CM | POA: Diagnosis not present

## 2020-05-02 DIAGNOSIS — E1169 Type 2 diabetes mellitus with other specified complication: Secondary | ICD-10-CM | POA: Diagnosis not present

## 2020-05-14 DIAGNOSIS — G4733 Obstructive sleep apnea (adult) (pediatric): Secondary | ICD-10-CM | POA: Diagnosis not present

## 2020-08-06 ENCOUNTER — Ambulatory Visit (AMBULATORY_SURGERY_CENTER): Payer: Self-pay | Admitting: *Deleted

## 2020-08-06 ENCOUNTER — Other Ambulatory Visit: Payer: Self-pay

## 2020-08-06 VITALS — Ht 65.5 in | Wt 206.0 lb

## 2020-08-06 DIAGNOSIS — Z86010 Personal history of colonic polyps: Secondary | ICD-10-CM

## 2020-08-06 MED ORDER — SUPREP BOWEL PREP KIT 17.5-3.13-1.6 GM/177ML PO SOLN: 1.0000 | Freq: Once | ORAL | 0 refills | Status: AC

## 2020-08-06 NOTE — Progress Notes (Signed)
No egg or soy allergy known to patient  No issues with past sedation with any surgeries or procedures No intubation problems in the past  No FH of Malignant Hyperthermia No diet pills per patient No home 02 use per patient  No blood thinners per patient   Pt states issues with constipation - daily issue- uses Fiber Gummy- not a lot of meds- goes every other day - hard balls a lot of the times- has this issue all her life - last colon 07-2017 pt did just Suprep and no extra with a good prep   No A fib or A flutter  EMMI video to pt or via [place] 19 guidelines implemented in [place] today with Pt and RN  Pt is fully vaccinated  for Covid   Due to the COVID-19 pandemic we are asking patients to follow certain guidelines.  Pt aware of COVID protocols and [facility] guidelines

## 2020-08-12 DIAGNOSIS — G4733 Obstructive sleep apnea (adult) (pediatric): Secondary | ICD-10-CM | POA: Diagnosis not present

## 2020-08-21 ENCOUNTER — Other Ambulatory Visit: Payer: Self-pay

## 2020-08-21 ENCOUNTER — Ambulatory Visit (AMBULATORY_SURGERY_CENTER): Payer: Medicare PPO | Admitting: Gastroenterology

## 2020-08-21 ENCOUNTER — Encounter: Payer: Self-pay | Admitting: Gastroenterology

## 2020-08-21 VITALS — BP 118/63 | HR 55 | Temp 97.6°F | Resp 21 | Ht 67.0 in | Wt 206.0 lb

## 2020-08-21 DIAGNOSIS — D120 Benign neoplasm of cecum: Secondary | ICD-10-CM

## 2020-08-21 DIAGNOSIS — Z86010 Personal history of colonic polyps: Secondary | ICD-10-CM | POA: Diagnosis not present

## 2020-08-21 DIAGNOSIS — I10 Essential (primary) hypertension: Secondary | ICD-10-CM | POA: Diagnosis not present

## 2020-08-21 DIAGNOSIS — D122 Benign neoplasm of ascending colon: Secondary | ICD-10-CM | POA: Diagnosis not present

## 2020-08-21 DIAGNOSIS — E785 Hyperlipidemia, unspecified: Secondary | ICD-10-CM | POA: Diagnosis not present

## 2020-08-21 DIAGNOSIS — D123 Benign neoplasm of transverse colon: Secondary | ICD-10-CM | POA: Diagnosis not present

## 2020-08-21 DIAGNOSIS — D125 Benign neoplasm of sigmoid colon: Secondary | ICD-10-CM | POA: Diagnosis not present

## 2020-08-21 DIAGNOSIS — D124 Benign neoplasm of descending colon: Secondary | ICD-10-CM | POA: Diagnosis not present

## 2020-08-21 DIAGNOSIS — G4733 Obstructive sleep apnea (adult) (pediatric): Secondary | ICD-10-CM | POA: Diagnosis not present

## 2020-08-21 MED ORDER — SODIUM CHLORIDE 0.9 % IV SOLN: 500.0000 mL | Freq: Once | INTRAVENOUS | Status: DC

## 2020-08-21 NOTE — Progress Notes (Signed)
Called to room to assist during endoscopic procedure.  Patient ID and intended procedure confirmed with present staff. Received instructions for my participation in the procedure from the performing physician.  

## 2020-08-21 NOTE — Patient Instructions (Signed)
REad all of the handouts given to you by your recovery room nurse. ° °YOU HAD AN ENDOSCOPIC PROCEDURE TODAY AT THE  ENDOSCOPY CENTER:   Refer to the procedure report that was given to you for any specific questions about what was found during the examination.  If the procedure report does not answer your questions, please call your gastroenterologist to clarify.  If you requested that your care partner not be given the details of your procedure findings, then the procedure report has been included in a sealed envelope for you to review at your convenience later. ° °YOU SHOULD EXPECT: Some feelings of bloating in the abdomen. Passage of more gas than usual.  Walking can help get rid of the air that was put into your GI tract during the procedure and reduce the bloating. If you had a lower endoscopy (such as a colonoscopy or flexible sigmoidoscopy) you may notice spotting of blood in your stool or on the toilet paper. If you underwent a bowel prep for your procedure, you may not have a normal bowel movement for a few days. ° °Please Note:  You might notice some irritation and congestion in your nose or some drainage.  This is from the oxygen used during your procedure.  There is no need for concern and it should clear up in a day or so. ° °SYMPTOMS TO REPORT IMMEDIATELY: ° °Following lower endoscopy (colonoscopy or flexible sigmoidoscopy): ° Excessive amounts of blood in the stool ° Significant tenderness or worsening of abdominal pains ° Swelling of the abdomen that is new, acute ° Fever of 100°F or higher ° °For urgent or emergent issues, a gastroenterologist can be reached at any hour by calling [phone]. °Do not use MyChart messaging for urgent concerns.  ° ° °DIET:  We do recommend a small meal at first, but then you may proceed to your regular diet.  Drink plenty of fluids but you should avoid alcoholic beverages for 24 hours. °Try to eat a high fiber diet, and drink plenty of water. ° °ACTIVITY:   You should plan to take it easy for the rest of today and you should NOT DRIVE or use heavy machinery until tomorrow (because of the sedation medicines used during the test).   ° °FOLLOW UP: °Our staff will call the number listed on your records 48-72 hours following your procedure to check on you and address any questions or concerns that you may have regarding the information given to you following your procedure. If we do not reach you, we will leave a message.  We will attempt to reach you two times.  During this call, we will ask if you have developed any symptoms of COVID 19. If you develop any symptoms (ie: fever, flu-like symptoms, shortness of breath, cough etc.) before then, please call [phone].  If you test positive for Covid 19 in the 2 weeks post procedure, please call and report this information to us.   ° °If any biopsies were taken you will be contacted by phone or by letter within the next 1-3 weeks.  Please call us at [phone] if you have not heard about the biopsies in 3 weeks.  ° ° °SIGNATURES/CONFIDENTIALITY: °You and/or your care partner have signed paperwork which will be entered into your electronic medical record.  These signatures attest to the fact that that the information above on your After Visit Summary has been reviewed and is understood.  Full responsibility of the confidentiality of this discharge information   this discharge information lies with you and/or your care-partner.

## 2020-08-21 NOTE — Op Note (Signed)
Nichole Sandoval: Nichole Sandoval Procedure Date: 08/21/2020 2:08 PM MRN: [id number] Endoscopist: Ladene Artist , MD Age: 70 Referring MD:  Date of Birth: 11/29/1950 Gender: Female Account #: [id number] Procedure:                Colonoscopy Indications:              Surveillance: Personal history of adenomatous                            polyps on last colonoscopy 3 years ago Medicines:                Monitored Anesthesia Care Procedure:                Pre-Anesthesia Assessment:                           - Prior to the procedure, a History and Physical                            was performed, and patient medications and                            allergies were reviewed. The patient's tolerance of                            previous anesthesia was also reviewed. The risks                            and benefits of the procedure and the sedation                            options and risks were discussed with the patient.                            All questions were answered, and informed consent                            was obtained. Prior Anticoagulants: The patient has                            taken no previous anticoagulant or antiplatelet                            agents. ASA Grade Assessment: II - A patient with                            mild systemic disease. After reviewing the risks                            and benefits, the patient was deemed in                            satisfactory condition to undergo the procedure.  After obtaining informed consent, the colonoscope                            was passed under direct vision. Throughout the                            procedure, the patient's blood pressure, pulse, and                            oxygen saturations were monitored continuously. The                            Olympus CF-HQ190L ([id number]) Colonoscope was                            introduced through the anus  and advanced to the the                            cecum, identified by appendiceal orifice and                            ileocecal valve. The ileocecal valve, appendiceal                            orifice, and rectum were photographed. The quality                            of the bowel preparation was good. The colonoscopy                            was performed without difficulty. The patient                            tolerated the procedure well. Scope In: 2:13:48 PM Scope Out: 2:35:12 PM Scope Withdrawal Time: 0 hours 16 minutes 25 seconds  Total Procedure Duration: 0 hours 21 minutes 24 seconds  Findings:                 The perianal and digital rectal examinations were                            normal.                           Five sessile polyps were found in the sigmoid colon                            (1), descending colon (1), ascending colon (1) and                            cecum (2). The polyps were 4 to 6 mm in size. These                            polyps were removed with a cold snare. Resection  and retrieval were complete.                           A 4 mm polyp was found in the transverse colon. The                            polyp was sessile. The polyp was removed with a                            cold biopsy forceps. Resection and retrieval were                            complete.                           A few small-mouthed diverticula were found in the                            left colon.                           Internal hemorrhoids were found during                            retroflexion. The hemorrhoids were small and Grade                            I (internal hemorrhoids that do not prolapse).                           The exam was otherwise without abnormality on                            direct and retroflexion views. Complications:            No immediate complications. Estimated blood loss:                             None. Estimated Blood Loss:     Estimated blood loss: none. Impression:               - Five 4 to 6 mm polyps in the sigmoid colon, in                            the descending colon, in the ascending colon and in                            the cecum, removed with a cold snare. Resected and                            retrieved.                           - One 4 mm polyp in the transverse colon, removed  with a cold biopsy forceps. Resected and retrieved.                           - Mild diverticulosis in the left colon.                           - Internal hemorrhoids.                           - The examination was otherwise normal on direct                            and retroflexion views. Recommendation:           - Repeat colonoscopy after studies are complete for                            surveillance based on pathology results.                           - Patient has a contact number available for                            emergencies. The signs and symptoms of potential                            delayed complications were discussed with the                            patient. Return to normal activities tomorrow.                            Written discharge instructions were provided to the                            patient.                           - High fiber diet.                           - Continue present medications.                           - Await pathology results. Ladene Artist, MD 08/21/2020 2:39:08 PM This report has been signed electronically.

## 2020-08-21 NOTE — Progress Notes (Signed)
Report to PACU, RN, vss, BBS= Clear.  

## 2020-08-21 NOTE — Progress Notes (Signed)
Pt's states no medical or surgical changes since previsit or office visit. 

## 2020-08-23 ENCOUNTER — Telehealth: Payer: Self-pay

## 2020-08-23 NOTE — Telephone Encounter (Signed)
  Follow up Call-  Call back number 08/21/2020  Post procedure Call Back phone  # [phone]  Permission to leave phone message Yes  Some recent data might be hidden     Patient questions:  Do you have a fever, pain , or abdominal swelling? No. Pain Score  0 *  Have you tolerated food without any problems? Yes.    Have you been able to return to your normal activities? Yes.    Do you have any questions about your discharge instructions: Diet   No. Medications  No. Follow up visit  No.  Do you have questions or concerns about your Care? No.  Actions: * If pain score is 4 or above: No action needed, pain <4. 1. Have you developed a fever since your procedure? no  2.   Have you had an respiratory symptoms (SOB or cough) since your procedure? no  3.   Have you tested positive for COVID 19 since your procedure no  4.   Have you had any family members/close contacts diagnosed with the COVID 19 since your procedure?  no   If yes to any of these questions please route to Joylene John, RN and Joella Prince, RN

## 2020-09-03 ENCOUNTER — Encounter: Payer: Self-pay | Admitting: Gastroenterology

## 2020-09-27 DIAGNOSIS — E1169 Type 2 diabetes mellitus with other specified complication: Secondary | ICD-10-CM | POA: Diagnosis not present

## 2020-11-15 DIAGNOSIS — G4733 Obstructive sleep apnea (adult) (pediatric): Secondary | ICD-10-CM | POA: Diagnosis not present

## 2021-01-29 DIAGNOSIS — D2261 Melanocytic nevi of right upper limb, including shoulder: Secondary | ICD-10-CM | POA: Diagnosis not present

## 2021-01-29 DIAGNOSIS — L578 Other skin changes due to chronic exposure to nonionizing radiation: Secondary | ICD-10-CM | POA: Diagnosis not present

## 2021-01-29 DIAGNOSIS — L821 Other seborrheic keratosis: Secondary | ICD-10-CM | POA: Diagnosis not present

## 2021-01-29 DIAGNOSIS — D2239 Melanocytic nevi of other parts of face: Secondary | ICD-10-CM | POA: Diagnosis not present

## 2021-01-29 DIAGNOSIS — D225 Melanocytic nevi of trunk: Secondary | ICD-10-CM | POA: Diagnosis not present

## 2021-02-19 DIAGNOSIS — G4733 Obstructive sleep apnea (adult) (pediatric): Secondary | ICD-10-CM | POA: Diagnosis not present

## 2021-03-07 DIAGNOSIS — E669 Obesity, unspecified: Secondary | ICD-10-CM | POA: Diagnosis not present

## 2021-03-07 DIAGNOSIS — Z820 Family history of epilepsy and other diseases of the nervous system: Secondary | ICD-10-CM | POA: Diagnosis not present

## 2021-03-07 DIAGNOSIS — R030 Elevated blood-pressure reading, without diagnosis of hypertension: Secondary | ICD-10-CM | POA: Diagnosis not present

## 2021-03-07 DIAGNOSIS — K5900 Constipation, unspecified: Secondary | ICD-10-CM | POA: Diagnosis not present

## 2021-03-07 DIAGNOSIS — G4733 Obstructive sleep apnea (adult) (pediatric): Secondary | ICD-10-CM | POA: Diagnosis not present

## 2021-03-07 DIAGNOSIS — E785 Hyperlipidemia, unspecified: Secondary | ICD-10-CM | POA: Diagnosis not present

## 2021-03-07 DIAGNOSIS — Z6832 Body mass index (BMI) 32.0-32.9, adult: Secondary | ICD-10-CM | POA: Diagnosis not present

## 2021-03-07 DIAGNOSIS — K219 Gastro-esophageal reflux disease without esophagitis: Secondary | ICD-10-CM | POA: Diagnosis not present

## 2021-03-07 DIAGNOSIS — F3342 Major depressive disorder, recurrent, in full remission: Secondary | ICD-10-CM | POA: Diagnosis not present

## 2021-03-24 DIAGNOSIS — Z1231 Encounter for screening mammogram for malignant neoplasm of breast: Secondary | ICD-10-CM | POA: Diagnosis not present

## 2021-03-24 DIAGNOSIS — E669 Obesity, unspecified: Secondary | ICD-10-CM | POA: Diagnosis not present

## 2021-03-24 DIAGNOSIS — Z01419 Encounter for gynecological examination (general) (routine) without abnormal findings: Secondary | ICD-10-CM | POA: Diagnosis not present

## 2021-04-25 DIAGNOSIS — E119 Type 2 diabetes mellitus without complications: Secondary | ICD-10-CM | POA: Diagnosis not present

## 2021-05-12 DIAGNOSIS — Z0000 Encounter for general adult medical examination without abnormal findings: Secondary | ICD-10-CM | POA: Diagnosis not present

## 2021-05-12 DIAGNOSIS — E559 Vitamin D deficiency, unspecified: Secondary | ICD-10-CM | POA: Diagnosis not present

## 2021-05-12 DIAGNOSIS — F322 Major depressive disorder, single episode, severe without psychotic features: Secondary | ICD-10-CM | POA: Diagnosis not present

## 2021-05-12 DIAGNOSIS — E1169 Type 2 diabetes mellitus with other specified complication: Secondary | ICD-10-CM | POA: Diagnosis not present

## 2021-05-12 DIAGNOSIS — E7800 Pure hypercholesterolemia, unspecified: Secondary | ICD-10-CM | POA: Diagnosis not present

## 2021-05-14 DIAGNOSIS — E7800 Pure hypercholesterolemia, unspecified: Secondary | ICD-10-CM | POA: Diagnosis not present

## 2021-05-14 DIAGNOSIS — E559 Vitamin D deficiency, unspecified: Secondary | ICD-10-CM | POA: Diagnosis not present

## 2021-05-14 DIAGNOSIS — Z0000 Encounter for general adult medical examination without abnormal findings: Secondary | ICD-10-CM | POA: Diagnosis not present

## 2021-05-14 DIAGNOSIS — E669 Obesity, unspecified: Secondary | ICD-10-CM | POA: Diagnosis not present

## 2021-05-14 DIAGNOSIS — Q810 Epidermolysis bullosa simplex: Secondary | ICD-10-CM | POA: Diagnosis not present

## 2021-05-14 DIAGNOSIS — F322 Major depressive disorder, single episode, severe without psychotic features: Secondary | ICD-10-CM | POA: Diagnosis not present

## 2021-05-14 DIAGNOSIS — E1169 Type 2 diabetes mellitus with other specified complication: Secondary | ICD-10-CM | POA: Diagnosis not present

## 2021-05-14 DIAGNOSIS — K219 Gastro-esophageal reflux disease without esophagitis: Secondary | ICD-10-CM | POA: Diagnosis not present

## 2021-05-14 DIAGNOSIS — Z79899 Other long term (current) drug therapy: Secondary | ICD-10-CM | POA: Diagnosis not present

## 2021-05-20 DIAGNOSIS — G4733 Obstructive sleep apnea (adult) (pediatric): Secondary | ICD-10-CM | POA: Diagnosis not present

## 2021-06-04 DIAGNOSIS — M545 Low back pain, unspecified: Secondary | ICD-10-CM | POA: Diagnosis not present

## 2021-06-17 DIAGNOSIS — H903 Sensorineural hearing loss, bilateral: Secondary | ICD-10-CM | POA: Diagnosis not present

## 2021-09-26 DIAGNOSIS — M4722 Other spondylosis with radiculopathy, cervical region: Secondary | ICD-10-CM | POA: Diagnosis not present

## 2021-10-06 DIAGNOSIS — M4722 Other spondylosis with radiculopathy, cervical region: Secondary | ICD-10-CM | POA: Diagnosis not present

## 2021-10-17 DIAGNOSIS — M4692 Unspecified inflammatory spondylopathy, cervical region: Secondary | ICD-10-CM | POA: Diagnosis not present

## 2021-11-06 DIAGNOSIS — E1169 Type 2 diabetes mellitus with other specified complication: Secondary | ICD-10-CM | POA: Diagnosis not present

## 2021-11-11 DIAGNOSIS — E1169 Type 2 diabetes mellitus with other specified complication: Secondary | ICD-10-CM | POA: Diagnosis not present

## 2021-11-11 DIAGNOSIS — E669 Obesity, unspecified: Secondary | ICD-10-CM | POA: Diagnosis not present

## 2021-11-19 DIAGNOSIS — E785 Hyperlipidemia, unspecified: Secondary | ICD-10-CM | POA: Diagnosis not present

## 2021-11-19 DIAGNOSIS — G4733 Obstructive sleep apnea (adult) (pediatric): Secondary | ICD-10-CM | POA: Diagnosis not present

## 2021-11-19 DIAGNOSIS — F325 Major depressive disorder, single episode, in full remission: Secondary | ICD-10-CM | POA: Diagnosis not present

## 2021-11-19 DIAGNOSIS — F419 Anxiety disorder, unspecified: Secondary | ICD-10-CM | POA: Diagnosis not present

## 2021-11-19 DIAGNOSIS — Z6832 Body mass index (BMI) 32.0-32.9, adult: Secondary | ICD-10-CM | POA: Diagnosis not present

## 2021-11-19 DIAGNOSIS — I951 Orthostatic hypotension: Secondary | ICD-10-CM | POA: Diagnosis not present

## 2021-11-19 DIAGNOSIS — E119 Type 2 diabetes mellitus without complications: Secondary | ICD-10-CM | POA: Diagnosis not present

## 2021-11-19 DIAGNOSIS — E669 Obesity, unspecified: Secondary | ICD-10-CM | POA: Diagnosis not present

## 2021-11-19 DIAGNOSIS — K219 Gastro-esophageal reflux disease without esophagitis: Secondary | ICD-10-CM | POA: Diagnosis not present

## 2022-02-05 DIAGNOSIS — L821 Other seborrheic keratosis: Secondary | ICD-10-CM | POA: Diagnosis not present

## 2022-02-05 DIAGNOSIS — D2239 Melanocytic nevi of other parts of face: Secondary | ICD-10-CM | POA: Diagnosis not present

## 2022-02-05 DIAGNOSIS — D2261 Melanocytic nevi of right upper limb, including shoulder: Secondary | ICD-10-CM | POA: Diagnosis not present

## 2022-02-05 DIAGNOSIS — D225 Melanocytic nevi of trunk: Secondary | ICD-10-CM | POA: Diagnosis not present

## 2022-02-05 DIAGNOSIS — L578 Other skin changes due to chronic exposure to nonionizing radiation: Secondary | ICD-10-CM | POA: Diagnosis not present

## 2022-04-14 DIAGNOSIS — E119 Type 2 diabetes mellitus without complications: Secondary | ICD-10-CM | POA: Diagnosis not present

## 2022-05-14 DIAGNOSIS — Z79899 Other long term (current) drug therapy: Secondary | ICD-10-CM | POA: Diagnosis not present

## 2022-05-14 DIAGNOSIS — E559 Vitamin D deficiency, unspecified: Secondary | ICD-10-CM | POA: Diagnosis not present

## 2022-05-14 DIAGNOSIS — E7800 Pure hypercholesterolemia, unspecified: Secondary | ICD-10-CM | POA: Diagnosis not present

## 2022-05-14 DIAGNOSIS — E1169 Type 2 diabetes mellitus with other specified complication: Secondary | ICD-10-CM | POA: Diagnosis not present

## 2022-05-19 DIAGNOSIS — E7800 Pure hypercholesterolemia, unspecified: Secondary | ICD-10-CM | POA: Diagnosis not present

## 2022-05-19 DIAGNOSIS — E1169 Type 2 diabetes mellitus with other specified complication: Secondary | ICD-10-CM | POA: Diagnosis not present

## 2022-05-19 DIAGNOSIS — F322 Major depressive disorder, single episode, severe without psychotic features: Secondary | ICD-10-CM | POA: Diagnosis not present

## 2022-05-19 DIAGNOSIS — Z6831 Body mass index (BMI) 31.0-31.9, adult: Secondary | ICD-10-CM | POA: Diagnosis not present

## 2022-05-19 DIAGNOSIS — E559 Vitamin D deficiency, unspecified: Secondary | ICD-10-CM | POA: Diagnosis not present

## 2022-05-19 DIAGNOSIS — Z0000 Encounter for general adult medical examination without abnormal findings: Secondary | ICD-10-CM | POA: Diagnosis not present

## 2022-06-08 ENCOUNTER — Other Ambulatory Visit: Payer: Self-pay | Admitting: Family Medicine

## 2022-06-08 DIAGNOSIS — E2839 Other primary ovarian failure: Secondary | ICD-10-CM

## 2022-07-29 DIAGNOSIS — H903 Sensorineural hearing loss, bilateral: Secondary | ICD-10-CM | POA: Diagnosis not present

## 2022-11-11 DIAGNOSIS — E1169 Type 2 diabetes mellitus with other specified complication: Secondary | ICD-10-CM | POA: Diagnosis not present

## 2022-11-17 DIAGNOSIS — E119 Type 2 diabetes mellitus without complications: Secondary | ICD-10-CM | POA: Diagnosis not present

## 2022-11-17 DIAGNOSIS — Z6831 Body mass index (BMI) 31.0-31.9, adult: Secondary | ICD-10-CM | POA: Diagnosis not present

## 2022-11-17 DIAGNOSIS — R030 Elevated blood-pressure reading, without diagnosis of hypertension: Secondary | ICD-10-CM | POA: Diagnosis not present

## 2022-11-26 ENCOUNTER — Ambulatory Visit
Admission: RE | Admit: 2022-11-26 | Discharge: 2022-11-26 | Disposition: A | Discharge status: Home / Self Care | Payer: Medicare PPO | Source: Ambulatory Visit | Attending: Family Medicine | Admitting: Family Medicine

## 2022-11-26 DIAGNOSIS — N959 Unspecified menopausal and perimenopausal disorder: Secondary | ICD-10-CM | POA: Diagnosis not present

## 2022-11-26 DIAGNOSIS — E349 Endocrine disorder, unspecified: Secondary | ICD-10-CM | POA: Diagnosis not present

## 2022-11-26 DIAGNOSIS — E2839 Other primary ovarian failure: Secondary | ICD-10-CM

## 2023-01-11 DIAGNOSIS — I951 Orthostatic hypotension: Secondary | ICD-10-CM | POA: Diagnosis not present

## 2023-01-11 DIAGNOSIS — E119 Type 2 diabetes mellitus without complications: Secondary | ICD-10-CM | POA: Diagnosis not present

## 2023-01-11 DIAGNOSIS — R32 Unspecified urinary incontinence: Secondary | ICD-10-CM | POA: Diagnosis not present

## 2023-01-11 DIAGNOSIS — E669 Obesity, unspecified: Secondary | ICD-10-CM | POA: Diagnosis not present

## 2023-01-11 DIAGNOSIS — G4733 Obstructive sleep apnea (adult) (pediatric): Secondary | ICD-10-CM | POA: Diagnosis not present

## 2023-01-11 DIAGNOSIS — K219 Gastro-esophageal reflux disease without esophagitis: Secondary | ICD-10-CM | POA: Diagnosis not present

## 2023-01-11 DIAGNOSIS — I10 Essential (primary) hypertension: Secondary | ICD-10-CM | POA: Diagnosis not present

## 2023-01-11 DIAGNOSIS — E785 Hyperlipidemia, unspecified: Secondary | ICD-10-CM | POA: Diagnosis not present

## 2023-01-11 DIAGNOSIS — F419 Anxiety disorder, unspecified: Secondary | ICD-10-CM | POA: Diagnosis not present

## 2023-04-09 DIAGNOSIS — D225 Melanocytic nevi of trunk: Secondary | ICD-10-CM | POA: Diagnosis not present

## 2023-04-09 DIAGNOSIS — L578 Other skin changes due to chronic exposure to nonionizing radiation: Secondary | ICD-10-CM | POA: Diagnosis not present

## 2023-04-09 DIAGNOSIS — D2261 Melanocytic nevi of right upper limb, including shoulder: Secondary | ICD-10-CM | POA: Diagnosis not present

## 2023-04-09 DIAGNOSIS — D2239 Melanocytic nevi of other parts of face: Secondary | ICD-10-CM | POA: Diagnosis not present

## 2023-04-09 DIAGNOSIS — L821 Other seborrheic keratosis: Secondary | ICD-10-CM | POA: Diagnosis not present

## 2023-04-13 DIAGNOSIS — G4733 Obstructive sleep apnea (adult) (pediatric): Secondary | ICD-10-CM | POA: Diagnosis not present

## 2023-04-15 DIAGNOSIS — E119 Type 2 diabetes mellitus without complications: Secondary | ICD-10-CM | POA: Diagnosis not present

## 2023-05-03 ENCOUNTER — Other Ambulatory Visit: Payer: Self-pay | Admitting: Family Medicine

## 2023-05-03 DIAGNOSIS — Z1231 Encounter for screening mammogram for malignant neoplasm of breast: Secondary | ICD-10-CM

## 2023-05-04 DIAGNOSIS — Z23 Encounter for immunization: Secondary | ICD-10-CM | POA: Diagnosis not present

## 2023-05-04 DIAGNOSIS — N6452 Nipple discharge: Secondary | ICD-10-CM | POA: Diagnosis not present

## 2023-05-04 DIAGNOSIS — Z6832 Body mass index (BMI) 32.0-32.9, adult: Secondary | ICD-10-CM | POA: Diagnosis not present

## 2023-05-06 ENCOUNTER — Other Ambulatory Visit: Payer: Self-pay | Admitting: Family Medicine

## 2023-05-06 DIAGNOSIS — N6452 Nipple discharge: Secondary | ICD-10-CM

## 2023-05-20 ENCOUNTER — Ambulatory Visit
Admission: RE | Admit: 2023-05-20 | Discharge: 2023-05-20 | Disposition: A | Discharge status: Home / Self Care | Payer: Medicare PPO | Source: Ambulatory Visit | Attending: Family Medicine | Admitting: Family Medicine

## 2023-05-20 ENCOUNTER — Ambulatory Visit: Payer: Medicare PPO

## 2023-05-20 DIAGNOSIS — N644 Mastodynia: Secondary | ICD-10-CM | POA: Diagnosis not present

## 2023-05-20 DIAGNOSIS — E559 Vitamin D deficiency, unspecified: Secondary | ICD-10-CM | POA: Diagnosis not present

## 2023-05-20 DIAGNOSIS — E1169 Type 2 diabetes mellitus with other specified complication: Secondary | ICD-10-CM | POA: Diagnosis not present

## 2023-05-20 DIAGNOSIS — N6452 Nipple discharge: Secondary | ICD-10-CM

## 2023-05-20 DIAGNOSIS — E7800 Pure hypercholesterolemia, unspecified: Secondary | ICD-10-CM | POA: Diagnosis not present

## 2023-05-26 DIAGNOSIS — F322 Major depressive disorder, single episode, severe without psychotic features: Secondary | ICD-10-CM | POA: Diagnosis not present

## 2023-05-26 DIAGNOSIS — M810 Age-related osteoporosis without current pathological fracture: Secondary | ICD-10-CM | POA: Diagnosis not present

## 2023-05-26 DIAGNOSIS — K219 Gastro-esophageal reflux disease without esophagitis: Secondary | ICD-10-CM | POA: Diagnosis not present

## 2023-05-26 DIAGNOSIS — E559 Vitamin D deficiency, unspecified: Secondary | ICD-10-CM | POA: Diagnosis not present

## 2023-05-26 DIAGNOSIS — E669 Obesity, unspecified: Secondary | ICD-10-CM | POA: Diagnosis not present

## 2023-05-26 DIAGNOSIS — E119 Type 2 diabetes mellitus without complications: Secondary | ICD-10-CM | POA: Diagnosis not present

## 2023-05-26 DIAGNOSIS — Z0000 Encounter for general adult medical examination without abnormal findings: Secondary | ICD-10-CM | POA: Diagnosis not present

## 2023-05-26 DIAGNOSIS — E7800 Pure hypercholesterolemia, unspecified: Secondary | ICD-10-CM | POA: Diagnosis not present

## 2023-06-09 DIAGNOSIS — M130 Polyarthritis, unspecified: Secondary | ICD-10-CM | POA: Diagnosis not present

## 2023-06-09 DIAGNOSIS — Z6833 Body mass index (BMI) 33.0-33.9, adult: Secondary | ICD-10-CM | POA: Diagnosis not present

## 2023-06-28 DIAGNOSIS — M130 Polyarthritis, unspecified: Secondary | ICD-10-CM | POA: Diagnosis not present

## 2023-06-28 DIAGNOSIS — Z6833 Body mass index (BMI) 33.0-33.9, adult: Secondary | ICD-10-CM | POA: Diagnosis not present

## 2023-06-28 DIAGNOSIS — E119 Type 2 diabetes mellitus without complications: Secondary | ICD-10-CM | POA: Diagnosis not present

## 2023-07-26 DIAGNOSIS — G4733 Obstructive sleep apnea (adult) (pediatric): Secondary | ICD-10-CM | POA: Diagnosis not present

## 2023-10-27 DIAGNOSIS — M130 Polyarthritis, unspecified: Secondary | ICD-10-CM | POA: Diagnosis not present

## 2023-11-11 DIAGNOSIS — M2540 Effusion, unspecified joint: Secondary | ICD-10-CM | POA: Diagnosis not present

## 2023-11-11 DIAGNOSIS — M2560 Stiffness of unspecified joint, not elsewhere classified: Secondary | ICD-10-CM | POA: Diagnosis not present

## 2023-11-11 DIAGNOSIS — Z6834 Body mass index (BMI) 34.0-34.9, adult: Secondary | ICD-10-CM | POA: Diagnosis not present

## 2023-11-11 DIAGNOSIS — M0609 Rheumatoid arthritis without rheumatoid factor, multiple sites: Secondary | ICD-10-CM | POA: Diagnosis not present

## 2023-11-11 DIAGNOSIS — M79642 Pain in left hand: Secondary | ICD-10-CM | POA: Diagnosis not present

## 2023-11-11 DIAGNOSIS — M79641 Pain in right hand: Secondary | ICD-10-CM | POA: Diagnosis not present

## 2023-11-11 DIAGNOSIS — M810 Age-related osteoporosis without current pathological fracture: Secondary | ICD-10-CM | POA: Diagnosis not present

## 2023-11-11 DIAGNOSIS — E669 Obesity, unspecified: Secondary | ICD-10-CM | POA: Diagnosis not present

## 2023-11-17 DIAGNOSIS — M0230 Reiter's disease, unspecified site: Secondary | ICD-10-CM | POA: Diagnosis not present

## 2023-11-17 DIAGNOSIS — K219 Gastro-esophageal reflux disease without esophagitis: Secondary | ICD-10-CM | POA: Diagnosis not present

## 2023-11-17 DIAGNOSIS — E785 Hyperlipidemia, unspecified: Secondary | ICD-10-CM | POA: Diagnosis not present

## 2023-11-17 DIAGNOSIS — F419 Anxiety disorder, unspecified: Secondary | ICD-10-CM | POA: Diagnosis not present

## 2023-11-17 DIAGNOSIS — K5900 Constipation, unspecified: Secondary | ICD-10-CM | POA: Diagnosis not present

## 2023-11-17 DIAGNOSIS — E119 Type 2 diabetes mellitus without complications: Secondary | ICD-10-CM | POA: Diagnosis not present

## 2023-11-17 DIAGNOSIS — F325 Major depressive disorder, single episode, in full remission: Secondary | ICD-10-CM | POA: Diagnosis not present

## 2023-11-17 DIAGNOSIS — D84821 Immunodeficiency due to drugs: Secondary | ICD-10-CM | POA: Diagnosis not present

## 2023-11-17 DIAGNOSIS — I10 Essential (primary) hypertension: Secondary | ICD-10-CM | POA: Diagnosis not present

## 2023-11-18 ENCOUNTER — Encounter: Payer: Self-pay | Admitting: Internal Medicine

## 2023-11-18 DIAGNOSIS — E1169 Type 2 diabetes mellitus with other specified complication: Secondary | ICD-10-CM | POA: Diagnosis not present

## 2023-11-25 DIAGNOSIS — Z6833 Body mass index (BMI) 33.0-33.9, adult: Secondary | ICD-10-CM | POA: Diagnosis not present

## 2023-11-25 DIAGNOSIS — M1990 Unspecified osteoarthritis, unspecified site: Secondary | ICD-10-CM | POA: Diagnosis not present

## 2023-11-25 DIAGNOSIS — E1169 Type 2 diabetes mellitus with other specified complication: Secondary | ICD-10-CM | POA: Diagnosis not present

## 2023-11-25 DIAGNOSIS — H9202 Otalgia, left ear: Secondary | ICD-10-CM | POA: Diagnosis not present

## 2023-12-15 DIAGNOSIS — G4733 Obstructive sleep apnea (adult) (pediatric): Secondary | ICD-10-CM | POA: Diagnosis not present

## 2023-12-28 ENCOUNTER — Ambulatory Visit (AMBULATORY_SURGERY_CENTER)

## 2023-12-28 ENCOUNTER — Encounter: Payer: Self-pay | Admitting: Internal Medicine

## 2023-12-28 VITALS — Ht 66.0 in | Wt 206.0 lb

## 2023-12-28 DIAGNOSIS — Z860100 Personal history of colon polyps, unspecified: Secondary | ICD-10-CM

## 2023-12-28 MED ORDER — NA SULFATE-K SULFATE-MG SULF 17.5-3.13-1.6 GM/177ML PO SOLN
1.0000 | Freq: Once | ORAL | 0 refills | Status: AC
Start: 2023-12-28 — End: 2023-12-28

## 2023-12-28 NOTE — Progress Notes (Signed)
 No egg or soy allergy known to patient  No issues known to pt with past sedation with any surgeries or procedures Patient denies ever being told they had issues or difficulty with intubation  No FH of Malignant Hyperthermia Pt is not on diet pills Pt is not on  home 02  OSA using CPAP Pt is not on blood thinners  Constipation: yes  No A fib or A flutter Have any cardiac testing pending-- no  LOA: independent  Prep: suprep   Patient's chart reviewed by Rogena Class CNRA prior to previsit and patient appropriate for the [facility].  Previsit completed and red dot placed by patient's name on their procedure day (on provider's schedule).     PV completed with patient. Prep instructions sent via mychart and home address.

## 2023-12-28 NOTE — Patient Instructions (Signed)
 ONCE A WEEK INJECTIONS Ozempic,  Mounjaro, Wegovy, Trulicity, Tanzeum, Byetta, Victoza, Bydureon, & SymlinPen  -DO NOT TAKE 7 days prior to the procedure.  Last dose on or before Monday 6/9 failure to hold this medication will result in a cancellation or rescheduling of your procedure

## 2024-01-10 DIAGNOSIS — G4733 Obstructive sleep apnea (adult) (pediatric): Secondary | ICD-10-CM | POA: Diagnosis not present

## 2024-01-10 NOTE — Progress Notes (Unsigned)
 [facility] History and Physical   Primary Care Physician:  Jimmey Mould, MD   Reason for Procedure:  History of colon polyps  Plan:    Colonoscopy     HPI: Nichole Sandoval is a 73 y.o. female previously cared for by Dr. Sandrea Cruel with a history of colon polyps.  2019 she had 7 polyps 6 adenomas max 10 mm and also a sessile serrated polyp.  In 2022 there were 5 polyps removed and at least 2 were adenomas.  She also has a history of left-sided diverticulosis and internal hemorrhoids.  A paternal grandmother had rectal cancer diagnosed at age 24 but she lived to be 80.   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   Family history of adverse reaction to anesthesia    pts sister did not awaken easily    GERD (gastroesophageal reflux disease)    History of kidney stones    Hyperlipidemia    on meds- on Zetia three times a week , pravastatin once a week    Neuromuscular disorder (HCC)    Bell's Palsy- in the 7th grade age 37    OSA (obstructive sleep apnea)    Osteoarthritis    Osteopenia    Sleep apnea    Cpap    Past Surgical History:  Procedure Laterality Date   ABDOMINAL HYSTERECTOMY     APPENDECTOMY     CESAREAN SECTION     x2   COLONOSCOPY     CYSTOSCOPY WITH RETROGRADE PYELOGRAM, URETEROSCOPY AND STENT PLACEMENT Left 08/02/2016   Procedure: CYSTOSCOPY WITH RETROGRADE PYELOGRAM, URETEROSCOPY AND STENT PLACEMENT;  Surgeon: Homero Luster, MD;  Location: [facility];  Service: Urology;  Laterality: Left;   HOLMIUM LASER APPLICATION Left 08/18/2016   Procedure: HOLMIUM LASER APPLICATION;  Surgeon: Homero Luster, MD;  Location: [facility];  Service: Urology;  Laterality: Left;   KNEE ARTHROSCOPY     POLYPECTOMY     SHOULDER SURGERY     URETEROSCOPY WITH HOLMIUM LASER LITHOTRIPSY Left 08/18/2016    Procedure: URETEROSCOPY WITH HOLMIUM LASER LITHOTRIPSY/STENT;  Surgeon: Homero Luster, MD;  Location: [facility];  Service: Urology;  Laterality: Left;   VAGINAL HYSTERECTOMY      Prior to Admission medications   Medication Sig Start Date End Date Taking? Authorizing Provider  Cholecalciferol 50 MCG (2000 UT) CAPS 2 capsules 03/18/18   [provider]  Docusate Sodium  (DSS) 100 MG CAPS Take 1 capsule by mouth as needed.    [provider]  ezetimibe (ZETIA) 10 MG tablet Three times a week - Monday Wednesday Friday 09/20/18   [provider]  famotidine (PEPCID) 40 MG tablet Take 40 mg by mouth as needed.    [provider]  Fiber Adult Gummies 2 g CHEW Chew 2 Pieces by mouth as needed.    [provider]  FLUoxetine  (PROZAC ) 20 MG tablet Take 20 mg by mouth 3 (three) times a week. Mondays, Wednesdays, Friday    [provider]  hydroxychloroquine (PLAQUENIL) 200 MG tablet Take 200 mg by mouth 2 (two) times daily.    [provider]  [facility] DELICA LANCETS 33G MISC Inject 1 applicator into the skin 3 times/day as needed-between meals & bedtime. 07/02/17   [provider]  ONETOUCH VERIO test strip Inject 1 application into the skin 3 times/day as needed-between meals & bedtime. 07/31/17   [provider]  OZEMPIC, 0.25 OR 0.5 MG/DOSE, 2 MG/3ML SOPN Take 0.5 mg by mouth once a week.    [provider]  pravastatin (PRAVACHOL) 20 MG tablet Take 20 mg by mouth. Once a week 03/18/18   [provider]  zinc gluconate 50 MG tablet Take 50 mg by mouth daily.    [provider]    Current Outpatient Medications  Medication Sig Dispense Refill   Cholecalciferol 50 MCG (2000 UT) CAPS 2 capsules     Docusate Sodium  (DSS) 100 MG CAPS Take 1 capsule by mouth as needed.     ezetimibe (ZETIA) 10 MG tablet Three times a week - Monday Wednesday Friday     famotidine (PEPCID) 40 MG tablet Take 40 mg by mouth as  needed.     Fiber Adult Gummies 2 g CHEW Chew 2 Pieces by mouth as needed.     FLUoxetine  (PROZAC ) 20 MG tablet Take 20 mg by mouth 3 (three) times a week. Mondays, Wednesdays, Friday     hydroxychloroquine (PLAQUENIL) 200 MG tablet Take 200 mg by mouth 2 (two) times daily.     ONETOUCH DELICA LANCETS 33G MISC Inject 1 applicator into the skin 3 times/day as needed-between meals & bedtime.  12   ONETOUCH VERIO test strip Inject 1 application into the skin 3 times/day as needed-between meals & bedtime.  12   pantoprazole (PROTONIX) 40 MG tablet Take 40 mg by mouth every other day.     pravastatin (PRAVACHOL) 20 MG tablet Take 20 mg by mouth. Once a week     OZEMPIC, 0.25 OR 0.5 MG/DOSE, 2 MG/3ML SOPN Take 0.5 mg by mouth once a week.     zinc gluconate 50 MG tablet Take 50 mg by mouth daily.     Current Facility-Administered Medications  Medication Dose Route Frequency Provider Last Rate Last Admin   0.9 %  sodium chloride  infusion  500 mL Intravenous Once Kenney Peacemaker, MD        Allergies as of 01/11/2024 - Review Complete 01/11/2024  Allergen Reaction Noted   Amoxicillin-pot clavulanate Other (See Comments) and Hypertension 07/14/2012   Oxycodone  Nausea And Vomiting 08/11/2016   Phentermine hcl Other (See Comments) 05/02/2020   Tape Other (See Comments) 08/06/2016   Tizanidine Other (See Comments) 12/28/2023    Family History  Problem Relation Age of Onset   Rectal cancer Paternal Grandmother 64       lived to be  age 93   Parkinson's disease Father    Failure to thrive Mother    Colon cancer Neg Hx    Stomach cancer Neg Hx    Colon polyps Neg Hx    Esophageal cancer Neg Hx     Social History   Socioeconomic History   Marital status: Married    Spouse name: Not on file   Number of children: 2   Years of education: Not on file   Highest education level: Not on file  Occupational History   Not on file  Tobacco Use   Smoking status: Never   Smokeless tobacco: Never   Vaping Use   Vaping status: Never Used  Substance and Sexual Activity   Alcohol use: Yes    Comment: occas. wine per pt   Drug use: No   Sexual activity: Not on file  Other Topics Concern   Not  on file  Social History Narrative   Not on file   Social Drivers of Health   Financial Resource Strain: Not on file  Food Insecurity: Not on file  Transportation Needs: Not on file  Physical Activity: Not on file  Stress: Not on file  Social Connections: Not on file  Intimate Partner Violence: Not on file    Review of Systems:  All other review of systems negative except as mentioned in the HPI.  Physical Exam: Vital signs BP (!) 152/70   Pulse 64   Temp (!) 97 F (36.1 C)   Resp 12   Ht 5' 6 (1.676 m)   Wt 206 lb (93.4 kg)   SpO2 100%   BMI 33.25 kg/m   General:   Alert,  Well-developed, well-nourished, pleasant and cooperative in NAD Lungs:  Clear throughout to auscultation.   Heart:  Regular rate and rhythm; no murmurs, clicks, rubs,  or gallops. Abdomen:  Soft, nontender and nondistended. Normal bowel sounds.   Neuro/Psych:  Alert and cooperative. Normal mood and affect. A and O x 3   @Supriya Beaston  Tammie Fall, MD, [facility] [phone] (pager) 01/11/2024 8:05 AM@

## 2024-01-11 ENCOUNTER — Ambulatory Visit (AMBULATORY_SURGERY_CENTER): Admitting: Internal Medicine

## 2024-01-11 ENCOUNTER — Encounter: Payer: Self-pay | Admitting: Internal Medicine

## 2024-01-11 VITALS — BP 113/47 | HR 58 | Temp 97.0°F | Resp 13 | Ht 66.0 in | Wt 206.0 lb

## 2024-01-11 DIAGNOSIS — D123 Benign neoplasm of transverse colon: Secondary | ICD-10-CM

## 2024-01-11 DIAGNOSIS — K5730 Diverticulosis of large intestine without perforation or abscess without bleeding: Secondary | ICD-10-CM

## 2024-01-11 DIAGNOSIS — K648 Other hemorrhoids: Secondary | ICD-10-CM | POA: Diagnosis not present

## 2024-01-11 DIAGNOSIS — D120 Benign neoplasm of cecum: Secondary | ICD-10-CM

## 2024-01-11 DIAGNOSIS — G4733 Obstructive sleep apnea (adult) (pediatric): Secondary | ICD-10-CM | POA: Diagnosis not present

## 2024-01-11 DIAGNOSIS — E119 Type 2 diabetes mellitus without complications: Secondary | ICD-10-CM | POA: Diagnosis not present

## 2024-01-11 DIAGNOSIS — F32A Depression, unspecified: Secondary | ICD-10-CM | POA: Diagnosis not present

## 2024-01-11 DIAGNOSIS — Z1211 Encounter for screening for malignant neoplasm of colon: Secondary | ICD-10-CM

## 2024-01-11 DIAGNOSIS — Z860101 Personal history of adenomatous and serrated colon polyps: Secondary | ICD-10-CM | POA: Diagnosis not present

## 2024-01-11 DIAGNOSIS — Z860100 Personal history of colon polyps, unspecified: Secondary | ICD-10-CM

## 2024-01-11 MED ORDER — SODIUM CHLORIDE 0.9 % IV SOLN: 500.0000 mL | Freq: Once | INTRAVENOUS | Status: DC

## 2024-01-11 NOTE — Op Note (Addendum)
 [facility] Patient Name: Nichole Sandoval Procedure Date: 01/11/2024 8:01 AM MRN: [id number] Endoscopist: Kenney Peacemaker , MD, [id number] Age: 73 Referring MD:  Date of Birth: Oct 28, 1950 Gender: Female Account #: [id number] Procedure:                Colonoscopy Indications:              Surveillance: Personal history of adenomatous                            polyps on last colonoscopy 3 years ago, Last                            colonoscopy: 2022 Medicines:                Monitored Anesthesia Care Procedure:                Pre-Anesthesia Assessment:                           - Prior to the procedure, a History and Physical                            was performed, and patient medications and                            allergies were reviewed. The patient's tolerance of                            previous anesthesia was also reviewed. The risks                            and benefits of the procedure and the sedation                            options and risks were discussed with the patient.                            All questions were answered, and informed consent                            was obtained. Prior Anticoagulants: The patient has                            taken no anticoagulant or antiplatelet agents. ASA                            Grade Assessment: III - A patient with severe                            systemic disease. After reviewing the risks and                            benefits, the patient was deemed in satisfactory  condition to undergo the procedure.                           After obtaining informed consent, the colonoscope                            was passed under direct vision. Throughout the                            procedure, the patient's blood pressure, pulse, and                            oxygen saturations were monitored continuously. The                            PCF-HQ190L Colonoscope [id number] was  introduced                            through the anus and advanced to the the cecum,                            identified by appendiceal orifice and ileocecal                            valve. The colonoscopy was performed without                            difficulty. The patient tolerated the procedure                            well. The quality of the bowel preparation was                            good. The ileocecal valve, appendiceal orifice, and                            rectum were photographed. The bowel preparation                            used was SUPREP via split dose instruction. Scope In: 8:12:27 AM Scope Out: 8:33:35 AM Scope Withdrawal Time: 0 hours 16 minutes 4 seconds  Total Procedure Duration: 0 hours 21 minutes 8 seconds  Findings:                 The perianal and digital rectal examinations were                            normal.                           Five sessile polyps were found in the transverse                            colon and cecum. The polyps were 1 to 5 mm in size.  These polyps were removed with a cold snare.                            Resection and retrieval were complete. Verification                            of patient identification for the specimen was                            done. Estimated blood loss was minimal.                           Multiple diverticula were found in the sigmoid                            colon.                           Internal hemorrhoids were found.                           The exam was otherwise without abnormality on                            direct and retroflexion views. Complications:            No immediate complications. Estimated Blood Loss:     Estimated blood loss was minimal. Impression:               - Five 1 to 5 mm polyps in the transverse colon and                            in the cecum, removed with a cold snare. Resected                            and  retrieved.                           - Diverticulosis in the sigmoid colon.                           - Internal hemorrhoids.                           - The examination was otherwise normal on direct                            and retroflexion views.                           - Personal history of colonic polyps. Recommendation:           - Patient has a contact number available for                            emergencies. The signs and symptoms of potential  delayed complications were discussed with the                            patient. Return to normal activities tomorrow.                            Written discharge instructions were provided to the                            patient.                           - Resume previous diet.                           - Continue present medications.                           - Repeat colonoscopy is recommended for                            surveillance. The colonoscopy date will be                            determined after pathology results from today's                            exam become available for review. Kenney Peacemaker, MD 01/11/2024 8:42:52 AM This report has been signed electronically.

## 2024-01-11 NOTE — Patient Instructions (Addendum)
 Please read handouts provided. Continue present medications. Resume previous diet.  YOU HAD AN ENDOSCOPIC PROCEDURE TODAY AT THE [facility]:   Refer to the procedure report that was given to you for any specific questions about what was found during the examination.  If the procedure report does not answer your questions, please call your gastroenterologist to clarify.  If you requested that your care partner not be given the details of your procedure findings, then the procedure report has been included in a sealed envelope for you to review at your convenience later.  YOU SHOULD EXPECT: Some feelings of bloating in the abdomen. Passage of more gas than usual.  Walking can help get rid of the air that was put into your GI tract during the procedure and reduce the bloating. If you had a lower endoscopy (such as a colonoscopy or flexible sigmoidoscopy) you may notice spotting of blood in your stool or on the toilet paper. If you underwent a bowel prep for your procedure, you may not have a normal bowel movement for a few days.  Please Note:  You might notice some irritation and congestion in your nose or some drainage.  This is from the oxygen used during your procedure.  There is no need for concern and it should clear up in a day or so.  SYMPTOMS TO REPORT IMMEDIATELY:  Following lower endoscopy (colonoscopy or flexible sigmoidoscopy):  Excessive amounts of blood in the stool  Significant tenderness or worsening of abdominal pains  Swelling of the abdomen that is new, acute  Fever of 100F or higher.  For urgent or emergent issues, a gastroenterologist can be reached at any hour by calling [phone]. Do not use MyChart messaging for urgent concerns.    DIET:  We do recommend a small meal at first, but then you may proceed to your regular diet.  Drink plenty of fluids but you should avoid alcoholic beverages for 24 hours.  ACTIVITY:  You should plan to take it easy for the  rest of today and you should NOT DRIVE or use heavy machinery until tomorrow (because of the sedation medicines used during the test).    FOLLOW UP: Our staff will call the number listed on your records the next business day following your procedure.  We will call around 7:15- 8:00 am to check on you and address any questions or concerns that you may have regarding the information given to you following your procedure. If we do not reach you, we will leave a message.     If any biopsies were taken you will be contacted by phone or by letter within the next 1-3 weeks.  Please call us  at [phone] if you have not heard about the biopsies in 3 weeks.    SIGNATURES/CONFIDENTIALITY: You and/or your care partner have signed paperwork which will be entered into your electronic medical record.  These signatures attest to the fact that that the information above on your After Visit Summary has been reviewed and is understood.  Full responsibility of the confidentiality of this discharge information lies with you and/or your care-partner.Five small (tiny) polyps removed.  I will let you know pathology results and when to have another routine colonoscopy by mail and/or My Chart.  Still have diverticulosis and hemorrhoids.  I appreciate the opportunity to care for you. Kenney Peacemaker, MD, [facility]

## 2024-01-11 NOTE — Progress Notes (Signed)
 Vss nad trans to pacu

## 2024-01-11 NOTE — Progress Notes (Signed)
 Called to room to assist during endoscopic procedure.  Patient ID and intended procedure confirmed with present staff. Received instructions for my participation in the procedure from the performing physician.

## 2024-01-11 NOTE — Progress Notes (Signed)
 Pt's states no medical or surgical changes since previsit or office visit.

## 2024-01-12 ENCOUNTER — Telehealth: Payer: Self-pay | Admitting: *Deleted

## 2024-01-12 NOTE — Telephone Encounter (Signed)
  Follow up Call-     01/11/2024    7:12 AM  Call back number  Post procedure Call Back phone  # [phone]  Permission to leave phone message Yes     Patient questions:  Do you have a fever, pain , or abdominal swelling? No. Pain Score  0 *  Have you tolerated food without any problems? Yes.    Have you been able to return to your normal activities? Yes.    Do you have any questions about your discharge instructions: Diet   No. Medications  No. Follow up visit  No.  Do you have questions or concerns about your Care? No.  Actions: * If pain score is 4 or above: No action needed, pain <4.

## 2024-01-13 DIAGNOSIS — Z6834 Body mass index (BMI) 34.0-34.9, adult: Secondary | ICD-10-CM | POA: Diagnosis not present

## 2024-01-13 DIAGNOSIS — E669 Obesity, unspecified: Secondary | ICD-10-CM | POA: Diagnosis not present

## 2024-01-13 DIAGNOSIS — M0609 Rheumatoid arthritis without rheumatoid factor, multiple sites: Secondary | ICD-10-CM | POA: Diagnosis not present

## 2024-01-13 DIAGNOSIS — M2540 Effusion, unspecified joint: Secondary | ICD-10-CM | POA: Diagnosis not present

## 2024-01-13 DIAGNOSIS — M810 Age-related osteoporosis without current pathological fracture: Secondary | ICD-10-CM | POA: Diagnosis not present

## 2024-01-13 DIAGNOSIS — M2560 Stiffness of unspecified joint, not elsewhere classified: Secondary | ICD-10-CM | POA: Diagnosis not present

## 2024-01-13 DIAGNOSIS — M79641 Pain in right hand: Secondary | ICD-10-CM | POA: Diagnosis not present

## 2024-01-13 DIAGNOSIS — M79642 Pain in left hand: Secondary | ICD-10-CM | POA: Diagnosis not present

## 2024-01-13 LAB — SURGICAL PATHOLOGY

## 2024-01-18 ENCOUNTER — Ambulatory Visit: Payer: Self-pay | Admitting: Internal Medicine

## 2024-01-18 ENCOUNTER — Encounter: Payer: Self-pay | Admitting: Internal Medicine

## 2024-01-18 DIAGNOSIS — Z860101 Personal history of adenomatous and serrated colon polyps: Secondary | ICD-10-CM | POA: Insufficient documentation

## 2024-01-31 DIAGNOSIS — M810 Age-related osteoporosis without current pathological fracture: Secondary | ICD-10-CM | POA: Diagnosis not present

## 2024-02-02 DIAGNOSIS — M810 Age-related osteoporosis without current pathological fracture: Secondary | ICD-10-CM | POA: Diagnosis not present

## 2024-02-09 DIAGNOSIS — G4733 Obstructive sleep apnea (adult) (pediatric): Secondary | ICD-10-CM | POA: Diagnosis not present

## 2024-03-06 ENCOUNTER — Encounter: Payer: Self-pay | Admitting: Cardiology

## 2024-03-06 ENCOUNTER — Ambulatory Visit: Attending: Cardiology | Admitting: Cardiology

## 2024-03-06 VITALS — BP 134/66 | HR 70 | Ht 66.0 in | Wt 216.0 lb

## 2024-03-06 DIAGNOSIS — R600 Localized edema: Secondary | ICD-10-CM | POA: Diagnosis not present

## 2024-03-06 DIAGNOSIS — E782 Mixed hyperlipidemia: Secondary | ICD-10-CM | POA: Insufficient documentation

## 2024-03-06 NOTE — Progress Notes (Signed)
  Cardiology Office Note:  .   Date:  03/06/2024  ID:  Nichole Sandoval, DOB [birth year], MRN [id number] PCP: Okey Carlin Redbird, MD  [facility] Providers Cardiologist:  Newman Lawrence, MD PCP: Okey Carlin Redbird, MD  Chief Complaint  Patient presents with   Edema   Hyperlipidemia     Nichole Sandoval is a 73 y.o. female with hyperlipidemia, type 2 DM, OSA, leg edema  Discussed the use of AI scribe software for clinical note transcription with the patient, who gave verbal consent to proceed.  History of Present Illness Nichole Sandoval is a 73 year old female with reactive arthritis who presents with leg swelling and joint pain. She was referred by her cousin for evaluation of leg swelling.  Leg swelling has worsened recently, particularly in the ankles and feet. She has gained weight from 189 to 216 pounds since November 2024, which she attributes to reduced exercise due to joint pain. Despite this, she remains active with water exercises three to four times a week.  She denies chest pain, shortness of breath, or other heart-related symptoms. She uses a CPAP machine at night and sleeps with pillows under her head, without experiencing nocturnal breathlessness or dyspnea on exertion. Hydroxychloroquine was effective until a recent mild vertigo episode. She avoids NSAIDs and ibuprofen.      Vitals:   03/06/24 0923  BP: 134/66  Pulse: 70  SpO2: 97%      Review of Systems  Cardiovascular:  Positive for leg swelling. Negative for chest pain, dyspnea on exertion, palpitations and syncope.        Studies Reviewed: SABRA        EKG 03/06/2024:  Normal sinus rhythm Normal ECG No previous ECGs available    Labs 04/2023-10/2023: Chol 185, TG 88, HDL 68, LDL 100 HbA1C 5.8% Hb 14.3 eGFR 71 TSH 2.4  Labs 04/2023: HbA1C 5.4% Hb 14.5 Cr 0.68   Physical Exam Vitals and nursing note reviewed.  Constitutional:      General: She is not in acute  distress. Neck:     Vascular: No JVD.  Cardiovascular:     Rate and Rhythm: Normal rate and regular rhythm.     Heart sounds: Normal heart sounds. No murmur heard. Pulmonary:     Effort: Pulmonary effort is normal.     Breath sounds: Normal breath sounds. No wheezing or rales.  Musculoskeletal:     Right lower leg: No edema.     Left lower leg: No edema.      VISIT DIAGNOSES:   ICD-10-CM   1. Leg edema  R60.0 EKG 12-Lead    ECHOCARDIOGRAM COMPLETE    Pro b natriuretic peptide (BNP)    2. Hyperlipemia, mixed  E78.2 EKG 12-Lead       Nichole Sandoval is a 73 y.o. female with leg edema Assessment & Plan Leg edema: Edema in lower extremities and hands. No acute cardiac issues suspected. - Order echocardiogram to assess heart function. - Check ProBNP levels to evaluate for heart failure. - Advise on potential use of compression stockings for varicose veins.   Mixed hyperlipidemia: Continue pravastatin     F/u as needed  Signed, Newman Nichole Lawrence, MD

## 2024-03-06 NOTE — Patient Instructions (Signed)
 Lab Work: Pro bnp  If you have labs (blood work) drawn today and your tests are completely normal, you will receive your results only by: [employer] (if you have MyChart) OR A paper copy in the mail If you have any lab test that is abnormal or we need to change your treatment, we will call you to review the results.  Testing/Procedures: Echo  Your physician has requested that you have an echocardiogram. Echocardiography is a painless test that uses sound waves to create images of your heart. It provides your doctor with information about the size and shape of your heart and how well your heart's chambers and valves are working. This procedure takes approximately one hour. There are no restrictions for this procedure. Please do NOT wear cologne, perfume, aftershave, or lotions (deodorant is allowed). Please arrive 15 minutes prior to your appointment time.  Please note: We ask at that you not bring children with you during ultrasound (echo/ vascular) testing. Due to room size and safety concerns, children are not allowed in the ultrasound rooms during exams. Our front office staff cannot provide observation of children in our lobby area while testing is being conducted. An adult accompanying a patient to their appointment will only be allowed in the ultrasound room at the discretion of the ultrasound technician under special circumstances. We apologize for any inconvenience.   Follow-Up: At [facility], you and your health needs are our priority.  As part of our continuing mission to provide you with exceptional heart care, our providers are all part of one team.  This team includes your primary Cardiologist (physician) and Advanced Practice Providers or APPs (Physician Assistants and Nurse Practitioners) who all work together to provide you with the care you need, when you need it.  Your next appointment:   As needed   Provider:   Newman JINNY Lawrence, MD

## 2024-03-07 LAB — PRO B NATRIURETIC PEPTIDE: NT-Pro BNP: 108 pg/mL (ref 0–301)

## 2024-03-08 ENCOUNTER — Ambulatory Visit: Payer: Self-pay | Admitting: Cardiology

## 2024-03-11 DIAGNOSIS — G4733 Obstructive sleep apnea (adult) (pediatric): Secondary | ICD-10-CM | POA: Diagnosis not present

## 2024-03-15 DIAGNOSIS — G4733 Obstructive sleep apnea (adult) (pediatric): Secondary | ICD-10-CM | POA: Diagnosis not present

## 2024-03-23 ENCOUNTER — Ambulatory Visit (HOSPITAL_COMMUNITY)
Admission: RE | Admit: 2024-03-23 | Discharge: 2024-03-23 | Disposition: A | Discharge status: Home / Self Care | Source: Ambulatory Visit | Attending: Cardiology | Admitting: Cardiology

## 2024-03-23 DIAGNOSIS — R600 Localized edema: Secondary | ICD-10-CM | POA: Insufficient documentation

## 2024-03-23 LAB — ECHOCARDIOGRAM COMPLETE
AR max vel: 2.58 cm2
AV Area VTI: 2.78 cm2
AV Area mean vel: 2.84 cm2
AV Mean grad: 4 mmHg
AV Peak grad: 8.9 mmHg
Ao pk vel: 1.49 m/s
Area-P 1/2: 3.5 cm2
S' Lateral: 2.83 cm

## 2024-03-28 DIAGNOSIS — E119 Type 2 diabetes mellitus without complications: Secondary | ICD-10-CM | POA: Diagnosis not present

## 2024-03-28 DIAGNOSIS — H43393 Other vitreous opacities, bilateral: Secondary | ICD-10-CM | POA: Diagnosis not present

## 2024-03-29 DIAGNOSIS — M79642 Pain in left hand: Secondary | ICD-10-CM | POA: Diagnosis not present

## 2024-03-29 DIAGNOSIS — R5383 Other fatigue: Secondary | ICD-10-CM | POA: Diagnosis not present

## 2024-03-29 DIAGNOSIS — M79641 Pain in right hand: Secondary | ICD-10-CM | POA: Diagnosis not present

## 2024-03-29 DIAGNOSIS — M2540 Effusion, unspecified joint: Secondary | ICD-10-CM | POA: Diagnosis not present

## 2024-03-29 DIAGNOSIS — M0609 Rheumatoid arthritis without rheumatoid factor, multiple sites: Secondary | ICD-10-CM | POA: Diagnosis not present

## 2024-03-29 DIAGNOSIS — M810 Age-related osteoporosis without current pathological fracture: Secondary | ICD-10-CM | POA: Diagnosis not present

## 2024-03-29 DIAGNOSIS — Z6834 Body mass index (BMI) 34.0-34.9, adult: Secondary | ICD-10-CM | POA: Diagnosis not present

## 2024-03-29 DIAGNOSIS — M2560 Stiffness of unspecified joint, not elsewhere classified: Secondary | ICD-10-CM | POA: Diagnosis not present

## 2024-03-29 DIAGNOSIS — Z111 Encounter for screening for respiratory tuberculosis: Secondary | ICD-10-CM | POA: Diagnosis not present

## 2024-04-10 DIAGNOSIS — G4733 Obstructive sleep apnea (adult) (pediatric): Secondary | ICD-10-CM | POA: Diagnosis not present

## 2024-04-11 DIAGNOSIS — G4733 Obstructive sleep apnea (adult) (pediatric): Secondary | ICD-10-CM | POA: Diagnosis not present

## 2024-04-14 DIAGNOSIS — D225 Melanocytic nevi of trunk: Secondary | ICD-10-CM | POA: Diagnosis not present

## 2024-04-14 DIAGNOSIS — D2261 Melanocytic nevi of right upper limb, including shoulder: Secondary | ICD-10-CM | POA: Diagnosis not present

## 2024-04-14 DIAGNOSIS — D485 Neoplasm of uncertain behavior of skin: Secondary | ICD-10-CM | POA: Diagnosis not present

## 2024-04-14 DIAGNOSIS — B353 Tinea pedis: Secondary | ICD-10-CM | POA: Diagnosis not present

## 2024-04-14 DIAGNOSIS — L821 Other seborrheic keratosis: Secondary | ICD-10-CM | POA: Diagnosis not present

## 2024-04-14 DIAGNOSIS — L578 Other skin changes due to chronic exposure to nonionizing radiation: Secondary | ICD-10-CM | POA: Diagnosis not present

## 2024-04-14 DIAGNOSIS — D2239 Melanocytic nevi of other parts of face: Secondary | ICD-10-CM | POA: Diagnosis not present

## 2024-05-04 DIAGNOSIS — M0609 Rheumatoid arthritis without rheumatoid factor, multiple sites: Secondary | ICD-10-CM | POA: Diagnosis not present

## 2024-05-09 DIAGNOSIS — L988 Other specified disorders of the skin and subcutaneous tissue: Secondary | ICD-10-CM | POA: Diagnosis not present

## 2024-05-09 DIAGNOSIS — D485 Neoplasm of uncertain behavior of skin: Secondary | ICD-10-CM | POA: Diagnosis not present

## 2024-05-18 ENCOUNTER — Other Ambulatory Visit: Payer: Self-pay | Admitting: Family Medicine

## 2024-05-18 DIAGNOSIS — Z1231 Encounter for screening mammogram for malignant neoplasm of breast: Secondary | ICD-10-CM

## 2024-05-23 ENCOUNTER — Ambulatory Visit

## 2024-06-06 DIAGNOSIS — E1169 Type 2 diabetes mellitus with other specified complication: Secondary | ICD-10-CM | POA: Diagnosis not present

## 2024-06-06 DIAGNOSIS — E559 Vitamin D deficiency, unspecified: Secondary | ICD-10-CM | POA: Diagnosis not present

## 2024-06-06 DIAGNOSIS — M810 Age-related osteoporosis without current pathological fracture: Secondary | ICD-10-CM | POA: Diagnosis not present

## 2024-06-06 DIAGNOSIS — E7800 Pure hypercholesterolemia, unspecified: Secondary | ICD-10-CM | POA: Diagnosis not present

## 2024-06-06 DIAGNOSIS — Z6833 Body mass index (BMI) 33.0-33.9, adult: Secondary | ICD-10-CM | POA: Diagnosis not present

## 2024-06-06 DIAGNOSIS — E669 Obesity, unspecified: Secondary | ICD-10-CM | POA: Diagnosis not present

## 2024-06-06 DIAGNOSIS — K219 Gastro-esophageal reflux disease without esophagitis: Secondary | ICD-10-CM | POA: Diagnosis not present

## 2024-06-06 DIAGNOSIS — F322 Major depressive disorder, single episode, severe without psychotic features: Secondary | ICD-10-CM | POA: Diagnosis not present

## 2024-06-06 DIAGNOSIS — Z0000 Encounter for general adult medical examination without abnormal findings: Secondary | ICD-10-CM | POA: Diagnosis not present

## 2024-06-08 ENCOUNTER — Ambulatory Visit
Admission: RE | Admit: 2024-06-08 | Discharge: 2024-06-08 | Disposition: A | Discharge status: Home / Self Care | Source: Ambulatory Visit | Attending: Family Medicine | Admitting: Family Medicine

## 2024-06-08 DIAGNOSIS — Z1231 Encounter for screening mammogram for malignant neoplasm of breast: Secondary | ICD-10-CM | POA: Diagnosis not present

## 2024-06-12 ENCOUNTER — Encounter (HOSPITAL_BASED_OUTPATIENT_CLINIC_OR_DEPARTMENT_OTHER): Payer: Self-pay | Admitting: Emergency Medicine

## 2024-06-12 ENCOUNTER — Emergency Department (HOSPITAL_BASED_OUTPATIENT_CLINIC_OR_DEPARTMENT_OTHER)
Admission: EM | Admit: 2024-06-12 | Discharge: 2024-06-12 | Disposition: A | Discharge status: Home / Self Care | Source: Ambulatory Visit | Attending: Emergency Medicine | Admitting: Emergency Medicine

## 2024-06-12 ENCOUNTER — Other Ambulatory Visit: Payer: Self-pay

## 2024-06-12 ENCOUNTER — Emergency Department (HOSPITAL_BASED_OUTPATIENT_CLINIC_OR_DEPARTMENT_OTHER)

## 2024-06-12 DIAGNOSIS — H538 Other visual disturbances: Secondary | ICD-10-CM | POA: Diagnosis not present

## 2024-06-12 DIAGNOSIS — R519 Headache, unspecified: Secondary | ICD-10-CM | POA: Diagnosis not present

## 2024-06-12 DIAGNOSIS — R9431 Abnormal electrocardiogram [ECG] [EKG]: Secondary | ICD-10-CM | POA: Diagnosis not present

## 2024-06-12 LAB — BASIC METABOLIC PANEL WITH GFR
Anion gap: 10 (ref 5–15)
BUN: 13 mg/dL (ref 8–23)
CO2: 25 mmol/L (ref 22–32)
Calcium: 9.3 mg/dL (ref 8.9–10.3)
Chloride: 107 mmol/L (ref 98–111)
Creatinine, Ser: 1.03 mg/dL — ABNORMAL HIGH (ref 0.44–1.00)
GFR, Estimated: 57 mL/min — ABNORMAL LOW (ref >60)
Glucose, Bld: 123 mg/dL — ABNORMAL HIGH (ref 70–99)
Potassium: 3.9 mmol/L (ref 3.5–5.1)
Sodium: 142 mmol/L (ref 135–145)

## 2024-06-12 LAB — CBC WITH DIFFERENTIAL/PLATELET
Abs Immature Granulocytes: 0.01 K/uL (ref 0.00–0.07)
Basophils Absolute: 0 K/uL (ref 0.0–0.1)
Basophils Relative: 1 %
Eosinophils Absolute: 0 K/uL (ref 0.0–0.5)
Eosinophils Relative: 1 %
HCT: 39 % (ref 36.0–46.0)
Hemoglobin: 13.4 g/dL (ref 12.0–15.0)
Immature Granulocytes: 0 %
Lymphocytes Relative: 33 %
Lymphs Abs: 1.5 K/uL (ref 0.7–4.0)
MCH: 31.7 pg (ref 26.0–34.0)
MCHC: 34.4 g/dL (ref 30.0–36.0)
MCV: 92.2 fL (ref 80.0–100.0)
Monocytes Absolute: 0.5 K/uL (ref 0.1–1.0)
Monocytes Relative: 11 %
Neutro Abs: 2.5 K/uL (ref 1.7–7.7)
Neutrophils Relative %: 54 %
Platelets: 235 K/uL (ref 150–400)
RBC: 4.23 MIL/uL (ref 3.87–5.11)
RDW: 14.8 % (ref 11.5–15.5)
WBC: 4.6 K/uL (ref 4.0–10.5)
nRBC: 0 % (ref 0.0–0.2)

## 2024-06-12 NOTE — ED Triage Notes (Signed)
 Reports vision changes in left eye x 3 weeks. States had similar episode last year.

## 2024-06-12 NOTE — ED Provider Notes (Signed)
 [facility] AT [facility] Provider Note   CSN: [id number] Arrival date & time: 06/12/24  1126     Patient presents with: Eye Problem   Nichole Sandoval is a 73 y.o. female.   Patient here with blurry vision in the left eye for the last 3 weeks.  She actually saw an eye doctor a week ago and it sounds like maybe her eye prescription was wrong causing her issues.  She had a dilated eye exam at that time that was unremarkable as well.  In that time she had been told by her primary care doctor to seek evaluation in the ED for this that she came here today.  But she actually got her eyeglass prescription in the mail just prior to coming here and she is feeling much better.  She is not having any blurred vision in her left eye anymore.  Does not have any weakness numbness tingling.  She is having some ear pain left facial pain at times but denies any speech problems vision loss stroke symptoms otherwise.  The history is provided by the patient.       Prior to Admission medications   Medication Sig Start Date End Date Taking? Authorizing Provider  Cholecalciferol 50 MCG (2000 UT) CAPS 2 capsules 03/18/18   [provider]  ezetimibe (ZETIA) 10 MG tablet Three times a week - Monday Wednesday Friday 09/20/18   [provider]  famotidine (PEPCID) 40 MG tablet Take 40 mg by mouth as needed.    [provider]  Fiber Adult Gummies 2 g CHEW Chew 2 Pieces by mouth as needed.    [provider]  FLUoxetine  (PROZAC ) 20 MG tablet Take 20 mg by mouth 3 (three) times a week. Mondays, Wednesdays, Friday    [provider]  hydroxychloroquine (PLAQUENIL) 200 MG tablet Take 200 mg by mouth 2 (two) times daily.    [provider]  OZEMPIC, 0.25 OR 0.5 MG/DOSE, 2 MG/3ML SOPN Take 0.5 mg by mouth once a week.    [provider]  pravastatin (PRAVACHOL) 20 MG tablet Take 20 mg by mouth. Once a week 03/18/18   [provider]    Allergies: Amoxicillin-pot clavulanate, Oxycodone , Phentermine hcl, Tape, and Tizanidine    Review of Systems  Updated Vital Signs BP (!) 172/68 (BP Location: Right Arm)   Pulse 69   Temp 98.2 F (36.8 C) (Oral)   Resp 18   SpO2 93%   Physical Exam Vitals and nursing note reviewed.  Constitutional:      General: She is not in acute distress.    Appearance: She is well-developed. She is not ill-appearing.  HENT:     Head: Normocephalic and atraumatic.  Eyes:     Extraocular Movements: Extraocular movements intact.     Conjunctiva/sclera: Conjunctivae normal.     Pupils: Pupils are equal, round, and reactive to light.     Comments: 20/20 vision bilaterally, visual fields intact normal extraocular movement  Cardiovascular:     Rate and Rhythm: Normal rate and regular rhythm.     Pulses: Normal pulses.     Heart sounds: Normal heart sounds. No murmur heard. Pulmonary:     Effort: Pulmonary effort is normal. No respiratory distress.     Breath sounds: Normal breath sounds.  Abdominal:     Palpations: Abdomen is soft.     Tenderness: There is no abdominal tenderness.  Musculoskeletal:        General:  No swelling.     Cervical back: Normal range of motion and neck supple.  Skin:    General: Skin is warm and dry.     Capillary Refill: Capillary refill takes less than 2 seconds.  Neurological:     General: No focal deficit present.     Mental Status: She is alert and oriented to person, place, and time.     Cranial Nerves: No cranial nerve deficit.     Sensory: No sensory deficit.     Motor: No weakness.     Coordination: Coordination normal.     Comments: 5+ out of 5 strength throughout, normal sensation, normal finger-nose-finger, normal speech  Psychiatric:        Mood and Affect: Mood normal.     (all labs ordered are listed, but only abnormal results are displayed) Labs Reviewed  BASIC METABOLIC PANEL WITH GFR - Abnormal; Notable for the  following components:      Result Value   Glucose, Bld 123 (*)    Creatinine, Ser 1.03 (*)    GFR, Estimated 57 (*)    All other components within normal limits  CBC WITH DIFFERENTIAL/PLATELET    EKG: None  Radiology: CT Head Wo Contrast Result Date: 06/12/2024 EXAM: CT HEAD WITHOUT CONTRAST 06/12/2024 12:29:00 PM TECHNIQUE: CT of the head was performed without the administration of intravenous contrast. Automated exposure control, iterative reconstruction, and/or weight based adjustment of the mA/kV was utilized to reduce the radiation dose to as low as reasonably achievable. COMPARISON: MRI of the head 09/14/2013 CLINICAL HISTORY: Headache, increasing frequency or severity FINDINGS: BRAIN AND VENTRICLES: No acute hemorrhage. No evidence of acute infarct. No hydrocephalus. No extra-axial collection. No mass effect or midline shift. ORBITS: No acute abnormality. SINUSES: No acute abnormality. SOFT TISSUES AND SKULL: No acute soft tissue abnormality. No skull fracture. IMPRESSION: 1. No acute intracranial abnormality. Electronically signed by: Gilmore Molt MD 06/12/2024 12:56 PM EST RP Workstation: [id number]     Procedures   Medications Ordered in the ED - No data to display                                  Medical Decision Making Amount and/or Complexity of Data Reviewed Labs: ordered. Radiology: ordered.   Nichole Sandoval is here for evaluation of eye issue.  However she states that she has been having blurred vision in the left eye for the last 3 weeks went to an eye doctor last week had a dilated eye exam that was unremarkable but they found that her left eye prescription has significantly changed and they thought maybe they had given her the wrong eye prescription last time she saw them.  Ultimately she got her glasses actually today in the mail prior to coming here and her eye vision seems much better.  Her visual fields are intact.  Her visual acuity is normal.  She is  actually not having any symptoms anymore.  She does have progressive lenses and somewhat getting used to the near vision but her far vision is good and she is not having blurriness in the eye anymore.  Sounds like she had a dilated eye exam as well that was okay.  But she has been having some left-sided headaches at times and ultimately will get a head CT basic labs.  EKG shows sinus rhythm.  No ischemic changes.  Ultimately looks like this was an intrinsic eye issue  that is now pretty much improved.  Will get some more reassurance of labs and imaging.  Dissipate discharge.  Overall lab work for my review and interpretation is unremarkable.  CT scan of the head is unremarkable.  Overall I do not have any concern for stroke or any other acute problem.  Seems like wearing her new glasses has solved her issue.  I suspect she had intrinsic eye issue with that prescription now that has been updated her symptoms seem to be better.  Follow-up primary care.  Discharge.  This chart was dictated using voice recognition software.  Despite best efforts to proofread,  errors can occur which can change the documentation meaning.      Final diagnoses:  Blurred vision    ED Discharge Orders     None          Ruthe Cornet, DO 06/12/24 1352

## 2024-06-12 NOTE — Discharge Instructions (Signed)
 Continue outpatient follow-up.  Given that your symptoms have improved with your new eyeglasses I suspect your issue was intrinsically with the eye.

## 2024-06-16 DIAGNOSIS — I70203 Unspecified atherosclerosis of native arteries of extremities, bilateral legs: Secondary | ICD-10-CM | POA: Diagnosis not present

## 2024-06-16 DIAGNOSIS — M792 Neuralgia and neuritis, unspecified: Secondary | ICD-10-CM | POA: Diagnosis not present

## 2024-06-16 DIAGNOSIS — E139 Other specified diabetes mellitus without complications: Secondary | ICD-10-CM | POA: Diagnosis not present

## 2024-06-16 DIAGNOSIS — B351 Tinea unguium: Secondary | ICD-10-CM | POA: Diagnosis not present

## 2024-07-04 DIAGNOSIS — M2560 Stiffness of unspecified joint, not elsewhere classified: Secondary | ICD-10-CM | POA: Diagnosis not present

## 2024-07-04 DIAGNOSIS — E669 Obesity, unspecified: Secondary | ICD-10-CM | POA: Diagnosis not present

## 2024-07-04 DIAGNOSIS — Z6833 Body mass index (BMI) 33.0-33.9, adult: Secondary | ICD-10-CM | POA: Diagnosis not present

## 2024-07-04 DIAGNOSIS — M2540 Effusion, unspecified joint: Secondary | ICD-10-CM | POA: Diagnosis not present

## 2024-07-04 DIAGNOSIS — M0609 Rheumatoid arthritis without rheumatoid factor, multiple sites: Secondary | ICD-10-CM | POA: Diagnosis not present

## 2024-07-04 DIAGNOSIS — M79641 Pain in right hand: Secondary | ICD-10-CM | POA: Diagnosis not present

## 2024-07-04 DIAGNOSIS — M810 Age-related osteoporosis without current pathological fracture: Secondary | ICD-10-CM | POA: Diagnosis not present

## 2024-07-04 DIAGNOSIS — M79642 Pain in left hand: Secondary | ICD-10-CM | POA: Diagnosis not present
